# Patient Record
Sex: Female | Born: 1985 | Race: Black or African American | Hispanic: No | Marital: Married | Smoking: Former smoker
Health system: Southern US, Community
[De-identification: ages and names within clinical notes are randomized; demographics above are authoritative.]

## PROBLEM LIST (undated history)

## (undated) DIAGNOSIS — Z789 Other specified health status: Secondary | ICD-10-CM

## (undated) HISTORY — DX: Other specified health status: Z78.9

## (undated) HISTORY — PX: MOUTH SURGERY: SHX715

---
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
## 2007-02-13 ENCOUNTER — Other Ambulatory Visit: Admission: RE | Admit: 2007-02-13 | Discharge: 2007-02-13 | Payer: Self-pay | Admitting: Family Medicine

## 2007-11-14 ENCOUNTER — Other Ambulatory Visit: Admission: RE | Admit: 2007-11-14 | Discharge: 2007-11-14 | Payer: Self-pay | Admitting: Obstetrics and Gynecology

## 2008-01-30 ENCOUNTER — Emergency Department (HOSPITAL_COMMUNITY): Admission: EM | Admit: 2008-01-30 | Discharge: 2008-01-30 | Payer: Self-pay | Admitting: Emergency Medicine

## 2008-03-16 ENCOUNTER — Encounter: Admission: RE | Admit: 2008-03-16 | Discharge: 2008-03-16 | Payer: Self-pay | Admitting: Family Medicine

## 2008-07-21 ENCOUNTER — Encounter: Admission: RE | Admit: 2008-07-21 | Discharge: 2008-07-21 | Payer: Self-pay | Admitting: Family Medicine

## 2008-12-07 ENCOUNTER — Other Ambulatory Visit: Admission: RE | Admit: 2008-12-07 | Discharge: 2008-12-07 | Payer: Self-pay | Admitting: Family Medicine

## 2009-06-19 ENCOUNTER — Emergency Department (HOSPITAL_COMMUNITY): Admission: EM | Admit: 2009-06-19 | Discharge: 2009-06-19 | Payer: Self-pay | Admitting: Emergency Medicine

## 2009-12-20 ENCOUNTER — Emergency Department (HOSPITAL_COMMUNITY): Admission: EM | Admit: 2009-12-20 | Discharge: 2009-12-20 | Payer: Self-pay | Admitting: Family Medicine

## 2010-10-02 ENCOUNTER — Encounter: Payer: Self-pay | Admitting: Family Medicine

## 2010-11-29 LAB — POCT URINALYSIS DIP (DEVICE)
Bilirubin Urine: NEGATIVE
Glucose, UA: NEGATIVE mg/dL
Ketones, ur: NEGATIVE mg/dL
Nitrite: NEGATIVE
Protein, ur: NEGATIVE mg/dL
Specific Gravity, Urine: 1.01 (ref 1.005–1.030)
Urobilinogen, UA: 0.2 mg/dL (ref 0.0–1.0)
pH: 6.5 (ref 5.0–8.0)

## 2010-11-29 LAB — URINE CULTURE: Colony Count: >=100000

## 2013-09-19 ENCOUNTER — Ambulatory Visit (INDEPENDENT_AMBULATORY_CARE_PROVIDER_SITE_OTHER): Payer: PRIVATE HEALTH INSURANCE | Admitting: Family Medicine

## 2013-09-19 VITALS — BP 116/72 | HR 74 | Temp 98.2°F | Resp 16 | Ht 64.0 in | Wt 130.0 lb

## 2013-09-19 DIAGNOSIS — Z8619 Personal history of other infectious and parasitic diseases: Secondary | ICD-10-CM

## 2013-09-19 DIAGNOSIS — Z0000 Encounter for general adult medical examination without abnormal findings: Secondary | ICD-10-CM

## 2013-09-19 DIAGNOSIS — D649 Anemia, unspecified: Secondary | ICD-10-CM

## 2013-09-19 DIAGNOSIS — R109 Unspecified abdominal pain: Secondary | ICD-10-CM

## 2013-09-19 DIAGNOSIS — R110 Nausea: Secondary | ICD-10-CM

## 2013-09-19 DIAGNOSIS — H7490 Unspecified disorder of middle ear and mastoid, unspecified ear: Secondary | ICD-10-CM

## 2013-09-19 DIAGNOSIS — B009 Herpesviral infection, unspecified: Secondary | ICD-10-CM

## 2013-09-19 DIAGNOSIS — Z3009 Encounter for other general counseling and advice on contraception: Secondary | ICD-10-CM

## 2013-09-19 LAB — POCT CBC
Granulocyte percent: 48.3 %G (ref 37–80)
HEMATOCRIT: 36.1 % — AB (ref 37.7–47.9)
Hemoglobin: 11 g/dL — AB (ref 12.2–16.2)
Lymph, poc: 2.8 (ref 0.6–3.4)
MCH: 23.7 pg — AB (ref 27–31.2)
MCHC: 30.5 g/dL — AB (ref 31.8–35.4)
MCV: 77.9 fL — AB (ref 80–97)
MID (CBC): 0.4 (ref 0–0.9)
MPV: 8.4 fL (ref 0–99.8)
PLATELET COUNT, POC: 273 10*3/uL (ref 142–424)
POC Granulocyte: 3 (ref 2–6.9)
POC LYMPH %: 44.9 % (ref 10–50)
POC MID %: 6.8 %M (ref 0–12)
RBC: 4.64 M/uL (ref 4.04–5.48)
RDW, POC: 17.4 %
WBC: 6.2 10*3/uL (ref 4.6–10.2)

## 2013-09-19 LAB — POCT WET PREP WITH KOH
BACTERIA WET PREP HPF POC: 4
KOH Prep POC: NEGATIVE
RBC WET PREP PER HPF POC: NEGATIVE
Trichomonas, UA: NEGATIVE
Yeast Wet Prep HPF POC: NEGATIVE

## 2013-09-19 LAB — POCT URINE PREGNANCY: Preg Test, Ur: NEGATIVE

## 2013-09-19 MED ORDER — VALACYCLOVIR HCL 500 MG PO TABS: 500.0000 mg | ORAL_TABLET | Freq: Every day | ORAL | Status: DC

## 2013-09-19 MED ORDER — ETONOGESTREL-ETHINYL ESTRADIOL 0.12-0.015 MG/24HR VA RING: VAGINAL_RING | VAGINAL | Status: DC

## 2013-09-19 NOTE — Patient Instructions (Addendum)
Your urine test for pregnancy today was negative, but this needs to be repeated in 2 weeks to verify this is negative. Ok to start Nuvaring now, but need to repeat urine test in 2 weeks.  You should receive a call or letter about your lab results within the next week to 10 days.  Return to discuss nausea further at next office visit - can discuss this in 2 weeks with recheck urine test.  Start iron once per day for your anemia. Recheck blood counts in next 6 weeks.  Valtrex once per day to decrease risk of transmission and outbreak, but if you do have an outbreak - increase to twice per day for 3 days.   Keeping You Healthy  Get These Tests 1. Blood Pressure- Have your blood pressure checked once a year by your health care provider.  Normal blood pressure is 120/80. 2. Weight- Have your body mass index (BMI) calculated to screen for obesity.  BMI is measure of body fat based on height and weight.  You can also calculate your own BMI at https://www.west-esparza.com/. 3. Cholesterol- Have your cholesterol checked every 5 years starting at age 79 then yearly starting at age 81. 4. Chlamydia, HIV, and other sexually transmitted diseases- Get screened every year until age 85, then within three months of each new sexual provider. 5. Pap Smear- Every 1-3 years; discuss with your health care provider. 6. Mammogram- Every year starting at age 35  Take these medicines  Calcium with Vitamin D-Your body needs 1200 mg of Calcium each day and [phone] IU of Vitamin D daily.  Your body can only absorb 500 mg of Calcium at a time so Calcium must be taken in 2 or 3 divided doses throughout the day.  Multivitamin with folic acid- Once daily if it is possible for you to become pregnant.  Get these Immunizations  Gardasil-Series of three doses; prevents HPV related illness such as genital warts and cervical cancer.  Menactra-Single dose; prevents meningitis.  Tetanus shot- Every 10 years.  Flu shot-Every  year.  Take these steps 1. Do not smoke-Your healthcare provider can help you quit.  For tips on how to quit go to www.smokefree.gov or call 1-800 QUITNOW. 2. Be physically active- Exercise 5 days a week for at least 30 minutes.  If you are not already physically active, start slow and gradually work up to 30 minutes of moderate physical activity.  Examples of moderate activity include walking briskly, dancing, swimming, bicycling, etc. 3. Breast Cancer- A self breast exam every month is important for early detection of breast cancer.  For more information and instruction on self breast exams, ask your healthcare provider or [email or web address]. 4. Eat a healthy diet- Eat a variety of healthy foods such as fruits, vegetables, whole grains, low fat milk, low fat cheeses, yogurt, lean meats, poultry and fish, beans, nuts, tofu, etc.  For more information go to www. Thenutritionsource.org 5. Drink alcohol in moderation- Limit alcohol intake to one drink or less per day. Never drink and drive. 6. Depression- Your emotional health is as important as your physical health.  If you're feeling down or losing interest in things you normally enjoy please talk to your healthcare provider about being screened for depression. 7. Dental visit- Brush and floss your teeth twice daily; visit your dentist twice a year. 8. Eye doctor- Get an eye exam at least every 2 years. 9. Helmet use- Always wear a helmet when riding a bicycle, motorcycle, rollerblading or skateboarding. 10.  Safe sex- If you may be exposed to sexually transmitted infections, use a condom. 11. Seat belts- Seat belts can save your live; always wear one. 12. Smoke/Carbon Monoxide detectors- These detectors need to be installed on the appropriate level of your home. Replace batteries at least once a year. 13. Skin cancer- When out in the sun please cover up and use sunscreen 15 SPF or higher. 14. Violence- If anyone is  threatening or hurting you, please tell your healthcare provider.

## 2013-09-19 NOTE — Progress Notes (Signed)
Subjective:    Patient ID: Tara Stewart, female    DOB: 1986/06/26, 28 y.o.   MRN: [id number]  HPI Tara Stewart is a 28 y.o. female New pt, here for CPE, and other concerns below.  No PCP at present. Has had physical various places prior.   Last Tetanus 7 years ago.  Had Gardasil x 3.  Unknown if Hep B vaccine.   Abdominal cramping - with most recent period before new years. Cramping gone now. GoPo.  Sexually active with fiancee - not intended to become pregnant, but not trying to become pregnant. Prior on Nuvaring - would like to restart this. LNMP - 12/25-12/31/14. Hx of STI  a year or two ago - thinks had chlamydia? She and partner both took medicine. Would like STI testing today.   Has genital herpes, acyclovir in past, felt like this made her have outbreak. No outbreak in past 2-3 years. Partner has not had genital herpes.   Nausea, for 4 years - seen for physicals, not specifically to address the nausea, but treated with samples a few years ago - unknown med.  No regular meds, no otc treatments. Comes and goes, daily. No heartburn. No abd pain, no unexplained wt loss, no current f/c - had some last week that resolved.   Fainting - hx of anemia. Last fainted in October of last year.   Seen by psychiatrist in past during family problems. Denies recent mental illness.    Headaches - migraines with menses?.  No recent change.   Review of Systems As above and per 13 point ROS on patient health survey.     Objective:   Physical Exam Filed Vitals:   09/19/13 1526  BP: 116/72  Pulse: 74  Temp: 98.2 F (36.8 C)  Resp: 16  Height: 5\' 4"  (1.626 m)  Weight: 130 lb (58.968 kg)  SpO2: 100%   Results for orders placed in visit on 09/19/13  POCT URINE PREGNANCY      Result Value Range   Preg Test, Ur Negative    POCT CBC      Result Value Range   WBC 6.2  4.6 - 10.2 K/uL   Lymph, poc 2.8  0.6 - 3.4   POC LYMPH PERCENT 44.9  10 - 50 %L   MID (cbc) 0.4  0 - 0.9   POC  MID % 6.8  0 - 12 %M   POC Granulocyte 3.0  2 - 6.9   Granulocyte percent 48.3  37 - 80 %G   RBC 4.64  4.04 - 5.48 M/uL   Hemoglobin 11.0 (*) 12.2 - 16.2 g/dL   HCT, POC 40.9 (*) 81.1 - 47.9 %   MCV 77.9 (*) 80 - 97 fL   MCH, POC 23.7 (*) 27 - 31.2 pg   MCHC 30.5 (*) 31.8 - 35.4 g/dL   RDW, POC 91.4     Platelet Count, POC 273  142 - 424 K/uL   MPV 8.4  0 - 99.8 fL  POCT WET PREP WITH KOH      Result Value Range   Trichomonas, UA Negative     Clue Cells Wet Prep HPF POC 0-2     Epithelial Wet Prep HPF POC 0-5     Yeast Wet Prep HPF POC neg     Bacteria Wet Prep HPF POC 4     RBC Wet Prep HPF POC neg     WBC Wet Prep HPF POC 0-4  KOH Prep POC Negative       Assessment & Plan:   Tara MallickBrooklyn L Moore is a 28 y.o. female Nausea alone - Plan: POCT urine pregnancy, POCT CBC, Comprehensive metabolic panel, Lipase, TSH. Longstanding sx's.  hcg negative today, but to repeat in 2 weeks as recent unprotected intercourse.   History of chlamydia infection - Plan: POCT Wet Prep with KOH, RPR, HIV antibody checked.  Pap 3. Declined hepatitis testing.   Routine general medical examination at a health care facility - Plan: RPR, Pap IG, CT/NG w/ reflex HPV when ASC-U, Hepatitis B surface antigen, Hepatitis B surface antibody, Hepatitis C antibody, TSH. Anticipatory guidance below.   Anemia - Plan: POCT CBC - microcytic - suspect iron deficient. Start otc iron sulfate qd and recheck levels in next 6 weeks.   Abdominal cramping - noted more with last menses. Plan: POCT CBC, POCT Wet Prep with KOH, HIV antibody, Comprehensive metabolic panel, Lipase  - recheck if recurs/worsens.   HSV-2 (herpes simplex virus 2) infection - Plan: RPR, HIV antibody, valACYclovir (VALTREX) 500 MG tablet QD, increase to BID if outbreak.   Other general counseling and advice for contraceptive management - Plan: etonogestrel-ethinyl estradiol (NUVARING) 0.12-0.015 MG/24HR vaginal ring. Option discussed for backup method  only until recheck hcg, but agreed to start nuvaring, recheck in 2 weeks for repeat HCG.  Meds ordered this encounter  Medications  . etonogestrel-ethinyl estradiol (NUVARING) 0.12-0.015 MG/24HR vaginal ring    Sig: Insert vaginally and leave in place for 3 consecutive weeks, then remove for 1 week.    Dispense:  1 each    Refill:  12  . valACYclovir (VALTREX) 500 MG tablet    Sig: Take 1 tablet (500 mg total) by mouth daily.    Dispense:  30 tablet    Refill:  11   Patient Instructions  Your urine test for pregnancy today was negative, but this needs to be repeated in 2 weeks to verify this is negative. Ok to start Nuvaring now, but need to repeat urine test in 2 weeks.  You should receive a call or letter about your lab results within the next week to 10 days.  Return to discuss nausea further at next office visit - can discuss this in 2 weeks with recheck urine test.  Start iron once per day for your anemia. Recheck blood counts in next 6 weeks.  Valtrex once per day to decrease risk of transmission and outbreak, but if you do have an outbreak - increase to twice per day for 3 days.   Keeping You Healthy  Get These Tests 1. Blood Pressure- Have your blood pressure checked once a year by your health care provider.  Normal blood pressure is 120/80. 2. Weight- Have your body mass index (BMI) calculated to screen for obesity.  BMI is measure of body fat based on height and weight.  You can also calculate your own BMI at https://www.west-esparza.com/www.nhlbisupport.com/bmi/. 3. Cholesterol- Have your cholesterol checked every 5 years starting at age 26 then yearly starting at age 26. 4. Chlamydia, HIV, and other sexually transmitted diseases- Get screened every year until age 26, then within three months of each new sexual provider. 5. Pap Smear- Every 1-3 years; discuss with your health care provider. 6. Mammogram- Every year starting at age 26  Take these medicines  Calcium with Vitamin D-Your body needs 1200 mg  of Calcium each day and [phone] IU of Vitamin D daily.  Your body can only absorb 500 mg  of Calcium at a time so Calcium must be taken in 2 or 3 divided doses throughout the day.  Multivitamin with folic acid- Once daily if it is possible for you to become pregnant.  Get these Immunizations  Gardasil-Series of three doses; prevents HPV related illness such as genital warts and cervical cancer.  Menactra-Single dose; prevents meningitis.  Tetanus shot- Every 10 years.  Flu shot-Every year.  Take these steps 1. Do not smoke-Your healthcare provider can help you quit.  For tips on how to quit go to www.smokefree.gov or call 1-800 QUITNOW. 2. Be physically active- Exercise 5 days a week for at least 30 minutes.  If you are not already physically active, start slow and gradually work up to 30 minutes of moderate physical activity.  Examples of moderate activity include walking briskly, dancing, swimming, bicycling, etc. 3. Breast Cancer- A self breast exam every month is important for early detection of breast cancer.  For more information and instruction on self breast exams, ask your healthcare provider or [place]. 4. Eat a healthy diet- Eat a variety of healthy foods such as fruits, vegetables, whole grains, low fat milk, low fat cheeses, yogurt, lean meats, poultry and fish, beans, nuts, tofu, etc.  For more information go to www. Thenutritionsource.org 5. Drink alcohol in moderation- Limit alcohol intake to one drink or less per day. Never drink and drive. 6. Depression- Your emotional health is as important as your physical health.  If you're feeling down or losing interest in things you normally enjoy please talk to your healthcare provider about being screened for depression. 7. Dental visit- Brush and floss your teeth twice daily; visit your dentist twice a year. 8. Eye doctor- Get an eye exam at least every 2 years. 9. Helmet use- Always wear a  helmet when riding a bicycle, motorcycle, rollerblading or skateboarding. 10. Safe sex- If you may be exposed to sexually transmitted infections, use a condom. 11. Seat belts- Seat belts can save your live; always wear one. 12. Smoke/Carbon Monoxide detectors- These detectors need to be installed on the appropriate level of your home. Replace batteries at least once a year. 13. Skin cancer- When out in the sun please cover up and use sunscreen 15 SPF or higher. 14. Violence- If anyone is threatening or hurting you, please tell your healthcare provider.

## 2013-09-20 LAB — HEPATITIS B SURFACE ANTIBODY, QUANTITATIVE: Hepatitis B-Post: 853 m[IU]/mL

## 2013-09-20 LAB — COMPREHENSIVE METABOLIC PANEL
ALT: 13 U/L (ref 0–35)
AST: 21 U/L (ref 0–37)
Albumin: 4.2 g/dL (ref 3.5–5.2)
Alkaline Phosphatase: 40 U/L (ref 39–117)
BILIRUBIN TOTAL: 0.5 mg/dL (ref 0.3–1.2)
BUN: 6 mg/dL (ref 6–23)
CALCIUM: 9.2 mg/dL (ref 8.4–10.5)
CHLORIDE: 106 meq/L (ref 96–112)
CO2: 25 meq/L (ref 19–32)
CREATININE: 0.67 mg/dL (ref 0.50–1.10)
GLUCOSE: 79 mg/dL (ref 70–99)
Potassium: 4 mEq/L (ref 3.5–5.3)
Sodium: 137 mEq/L (ref 135–145)
Total Protein: 6.5 g/dL (ref 6.0–8.3)

## 2013-09-20 LAB — TSH: TSH: 2.203 u[IU]/mL (ref 0.350–4.500)

## 2013-09-20 LAB — RPR

## 2013-09-20 LAB — HIV ANTIBODY (ROUTINE TESTING W REFLEX): HIV: NONREACTIVE

## 2013-09-20 LAB — HEPATITIS C ANTIBODY: HCV AB: NEGATIVE

## 2013-09-20 LAB — LIPASE: LIPASE: 24 U/L (ref 0–75)

## 2013-09-20 LAB — HEPATITIS B SURFACE ANTIGEN: Hepatitis B Surface Ag: NEGATIVE

## 2013-09-22 LAB — PAP IG, CT-NG, RFX HPV ASCU
CHLAMYDIA PROBE AMP: NEGATIVE
GC PROBE AMP: NEGATIVE

## 2013-09-24 LAB — HUMAN PAPILLOMAVIRUS, HIGH RISK: HPV DNA HIGH RISK: NOT DETECTED

## 2014-02-25 ENCOUNTER — Ambulatory Visit: Payer: PRIVATE HEALTH INSURANCE | Admitting: Women's Health

## 2016-01-03 LAB — CYTOLOGY - PAP: PAP SMEAR: NEGATIVE

## 2016-01-04 ENCOUNTER — Other Ambulatory Visit (HOSPITAL_COMMUNITY): Payer: Self-pay | Admitting: Obstetrics

## 2016-01-04 DIAGNOSIS — N979 Female infertility, unspecified: Secondary | ICD-10-CM

## 2016-01-09 ENCOUNTER — Ambulatory Visit (HOSPITAL_COMMUNITY)
Admission: RE | Admit: 2016-01-09 | Discharge: 2016-01-09 | Disposition: A | Discharge status: Home / Self Care | Payer: 59 | Source: Ambulatory Visit | Attending: Obstetrics | Admitting: Obstetrics

## 2016-01-09 DIAGNOSIS — N83201 Unspecified ovarian cyst, right side: Secondary | ICD-10-CM | POA: Diagnosis not present

## 2016-01-09 DIAGNOSIS — N979 Female infertility, unspecified: Secondary | ICD-10-CM | POA: Insufficient documentation

## 2016-01-09 DIAGNOSIS — N83202 Unspecified ovarian cyst, left side: Secondary | ICD-10-CM | POA: Diagnosis not present

## 2016-01-13 ENCOUNTER — Ambulatory Visit (HOSPITAL_COMMUNITY): Payer: 59

## 2016-04-09 ENCOUNTER — Encounter: Payer: Self-pay | Admitting: Obstetrics & Gynecology

## 2016-04-09 ENCOUNTER — Ambulatory Visit (INDEPENDENT_AMBULATORY_CARE_PROVIDER_SITE_OTHER): Payer: 59 | Admitting: Obstetrics & Gynecology

## 2016-04-09 VITALS — BP 125/88 | HR 77 | Temp 98.6°F | Ht 65.5 in | Wt 175.2 lb

## 2016-04-09 DIAGNOSIS — Z319 Encounter for procreative management, unspecified: Secondary | ICD-10-CM | POA: Diagnosis not present

## 2016-04-09 MED ORDER — CLOMIPHENE CITRATE 50 MG PO TABS: 100.0000 mg | ORAL_TABLET | Freq: Every day | ORAL | 2 refills | Status: DC

## 2016-04-09 NOTE — Progress Notes (Signed)
   CLINIC ENCOUNTER NOTE  History:  30 y.o. G0P0000 here today for follow up after being started on Clomid for infertility by Dr. Gaynell Face.  She was started on Clomid 50 mg; already had a negative pelvic ultrasound (no HSG) and husband had normal evaluation (there was a notation about 'slow sperm' but with normal follow up evaluation). She denies any abnormal vaginal discharge, bleeding, pelvic pain or other concerns.  She has regular periods.  No past medical history on file.  Past Surgical History:  Procedure Laterality Date  . MOUTH SURGERY      The following portions of the patient's history were reviewed and updated as appropriate: allergies, current medications, past family history, past medical history, past social history, past surgical history and problem list.   Health Maintenance:  Normal pap on 01/03/2016.    Review of Systems:  Pertinent items noted in HPI and remainder of comprehensive ROS otherwise negative.  Objective:  Physical Exam BP 125/88   Pulse 77   Temp 98.6 F (37 C) (Oral)   Ht 5' 5.5" (1.664 m)   Wt 175 lb 3.2 oz (79.5 kg)   LMP 03/01/2016 (Exact Date)   BMI 28.71 kg/m  CONSTITUTIONAL: Well-developed, well-nourished female in no acute distress.  HENT:  Normocephalic, atraumatic. External right and left ear normal. Oropharynx is clear and moist EYES: Conjunctivae and EOM are normal. Pupils are equal, round, and reactive to light. No scleral icterus.  NECK: Normal range of motion, supple, no masses SKIN: Skin is warm and dry. No rash noted. Not diaphoretic. No erythema. No pallor. NEUROLOGIC: Alert and oriented to person, place, and time. Normal reflexes, muscle tone coordination. No cranial nerve deficit noted. PSYCHIATRIC: Normal mood and affect. Normal behavior. Normal judgment and thought content. CARDIOVASCULAR: Normal heart rate noted RESPIRATORY: Effort and breath sounds normal, no problems with respiration noted ABDOMEN: Soft, no distention  noted.   PELVIC: Deferred MUSCULOSKELETAL: Normal range of motion. No edema noted.   Assessment & Plan:  Infertility management Increased dosage of Clomid to 100 mg.  Discussed possibility of Femara, patient will research and decide if she wants to try this medication in lieu of Clomid.  Discussed need for HSG later, if conception does not occur. Patient declines HSG for now and IVF specialist for now.  Routine preventative health maintenance measures emphasized. Please refer to After Visit Summary for other counseling recommendations.   Return in about 2 months (around 06/09/2016) for Followup infertility.   Total face-to-face time with patient: 20 minutes. Over 50% of encounter was spent on counseling and coordination of care.   Jaynie Collins, MD, FACOG Attending Obstetrician & Gynecologist, [facility] for [employer], [facility]

## 2016-04-09 NOTE — Patient Instructions (Addendum)
Femara  (letrozole) for ovulation induction   CLOMID PATIENT INSTRUCTIONS  WHY USE IT? Clomid helps your ovaries to release eggs (ovulate).  HOW TO USE IT? Clomid is taken as a pill usually on days 5,6,7,8, & 9 of your cycle.  Day 1 is the first day of your period. The dose or duration may be changed to achieve ovulation.  Provera (progesterone) may first be used to bring on a period for some patients.  The day of ovulation on Clomid is usually between cycle day 14 and 17.  Having sexual intercourse at least every other day between cycle day 13 and 18 will improve your chances of becoming pregnant during the Clomid cycle.  You may monitor your ovulation using basal body temperature charts or with ovulation kits.  If using the ovulation predictor kits, having intercourse the day of the surge and the two days following is recommended. If you get your period, call when it starts for an appointment with your doctor, so that an exam may be done, and another Clomid cycle can be considered if appropriate. If you do not get a period by day 35 of the cycle, please get a blood pregnancy test.  If it is negative, speak to your doctor for instructions to bring on another period and to plan a follow-up appointment.  THINGS TO KNOW: If you get pregnant while using Clomid, your chance of twins is 7% and triplets is less than 1%. Some studies have suggested the use of "fertility drugs" may increase your risk of ovarian cancers in the future.  It is unclear if these drugs increase the risk, or people who have problems with fertility are prone for these cancers.  If there is an actual risk, it is very low.  If you have a history of liver problems or ovarian cancer, it may be wise to avoid this medication.  SIDE EFFECTS:  The most common side effect is hot flashes (20%).  Breast tenderness, headaches, nausea, bloating may also occur at different times.  Less than 3/1,000 people have dryness or loss of  hair.  Persistent ovarian cysts may form from the use of this medication.  Ovarian hyperstimulation syndrome is a rare side effect at low doses.  Visual changes like flashes of light or blurring.

## 2016-04-13 ENCOUNTER — Telehealth: Payer: Self-pay | Admitting: *Deleted

## 2016-04-13 DIAGNOSIS — N979 Female infertility, unspecified: Secondary | ICD-10-CM

## 2016-04-13 NOTE — Telephone Encounter (Signed)
Patient states she was told if she decided to change her medication- she should call and let the provider know. She would like to change from the Clomid to Femara. Patient gives permission to leave messages on her voice mail as she is at work.

## 2016-04-16 ENCOUNTER — Encounter: Payer: Self-pay | Admitting: *Deleted

## 2016-04-16 MED ORDER — LETROZOLE 2.5 MG PO TABS: 2.5000 mg | ORAL_TABLET | Freq: Every day | ORAL | 2 refills | Status: DC

## 2016-04-16 NOTE — Telephone Encounter (Signed)
Femara prescribed. Patient called and her mailbox was full.  Please call later to tell her the medication was prescribed.  She can return to clinic in 2 months for reevaluation.  Tereso NewcomerUgonna A Nicholis Stepanek, MD

## 2016-04-16 NOTE — Addendum Note (Signed)
Addended by: Jaynie CollinsANYANWU, Abdi Husak A on: 04/16/2016 04:11 PM   Modules accepted: Orders

## 2016-04-23 ENCOUNTER — Encounter: Payer: Self-pay | Admitting: *Deleted

## 2016-04-23 NOTE — Telephone Encounter (Signed)
Attempt to call patient- mailbox full. Letter with provider recommendations mailed to patient

## 2016-04-26 ENCOUNTER — Telehealth: Payer: Self-pay | Admitting: Obstetrics & Gynecology

## 2016-04-26 NOTE — Telephone Encounter (Signed)
Pt stated that they discussed switching her Femara. Pt uses the [facility] on [address]. Please advise.

## 2016-04-27 NOTE — Telephone Encounter (Signed)
Letter has been mailed.

## 2016-05-05 ENCOUNTER — Encounter: Payer: Self-pay | Admitting: Obstetrics & Gynecology

## 2016-05-31 ENCOUNTER — Encounter: Payer: Self-pay | Admitting: Obstetrics & Gynecology

## 2016-10-24 ENCOUNTER — Encounter: Payer: Self-pay | Admitting: Obstetrics and Gynecology

## 2016-10-24 ENCOUNTER — Ambulatory Visit (INDEPENDENT_AMBULATORY_CARE_PROVIDER_SITE_OTHER): Payer: Managed Care, Other (non HMO) | Admitting: Obstetrics and Gynecology

## 2016-10-24 DIAGNOSIS — N979 Female infertility, unspecified: Secondary | ICD-10-CM

## 2016-10-24 DIAGNOSIS — N970 Female infertility associated with anovulation: Secondary | ICD-10-CM | POA: Insufficient documentation

## 2016-10-24 MED ORDER — LETROZOLE 2.5 MG PO TABS: 2.5000 mg | ORAL_TABLET | Freq: Every day | ORAL | 2 refills | Status: AC

## 2016-10-24 NOTE — Progress Notes (Addendum)
Patient is here to get Fertility medication- Femara.  Ms Tara Stewart is a nulligravid female here for follow up of her anovulatory cycles. She has had a normal U/S and her husband has had a normal semen analysis. Husband healthy, 7529 and has not fathered children. She has tried Clomid without success. She discussed trying Femara at last visit but never did. She would like to try.  Cycles are monthly q21-25 days, she thinks, last 7 days. Current LMP is today. She denies any chronic medical problems  PE AF VSS Lungs clear Heart RRR Abd soft + BS  A/P Anovulatory cycles  Will start Femara 2.5 mg CD 3-7. U/R/B reviewed. Will check progesterone level on CD 21.  Pt instructed to keep menses calendar. Timing of IC reviewed as well. F/U pending lab results.

## 2016-11-13 ENCOUNTER — Other Ambulatory Visit: Payer: Self-pay

## 2017-01-05 IMAGING — US US TRANSVAGINAL NON-OB
1 series · 15 of 25 positions shown · non-contrast
Comparison: None

CLINICAL DATA: Infertility.

EXAM:
TRANSABDOMINAL AND TRANSVAGINAL ULTRASOUND OF PELVIS
TECHNIQUE: Both transabdominal and transvaginal ultrasound examinations of the
pelvis were performed. Transabdominal technique was performed for
global imaging of the pelvis including uterus, ovaries, adnexal
regions, and pelvic cul-de-sac. It was necessary to proceed with
endovaginal exam following the transabdominal exam to visualize the
uterus and ovaries.

[Series 1: us transvaginal non-ob · 15 of 63 slices shown]
[im 1/63]
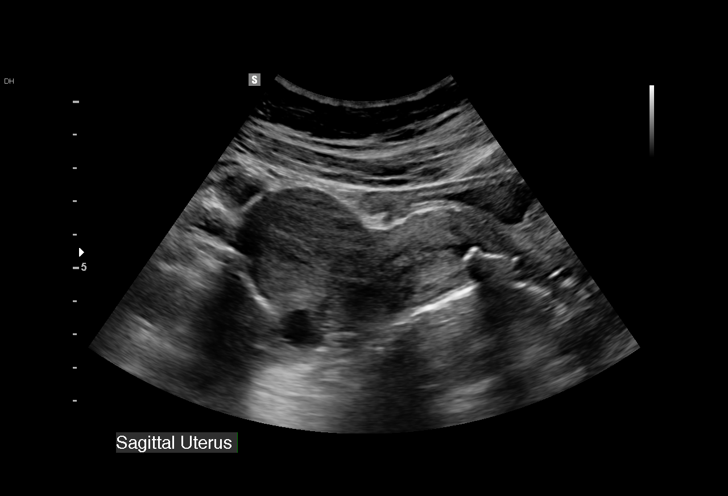
[im 6/63]
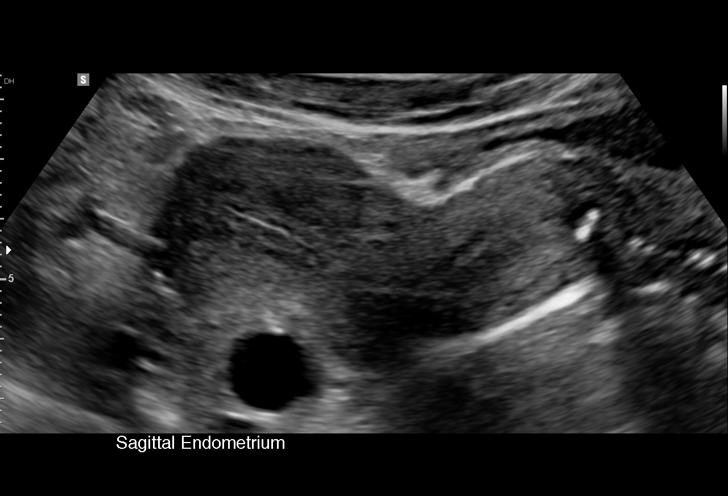
[im 11/63]
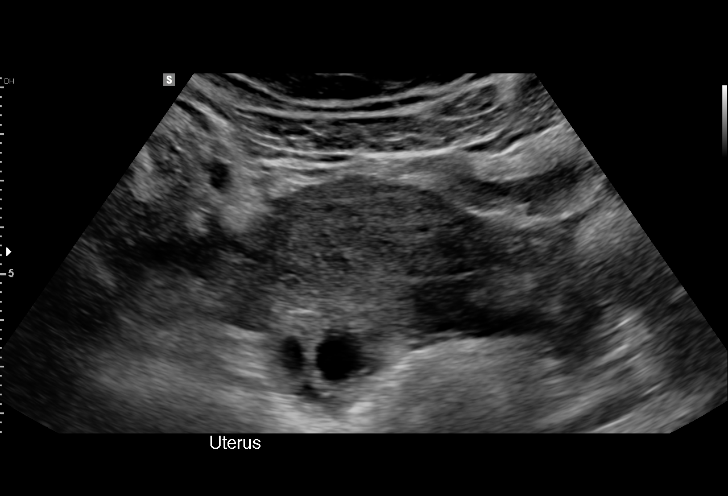
[im 13/63]
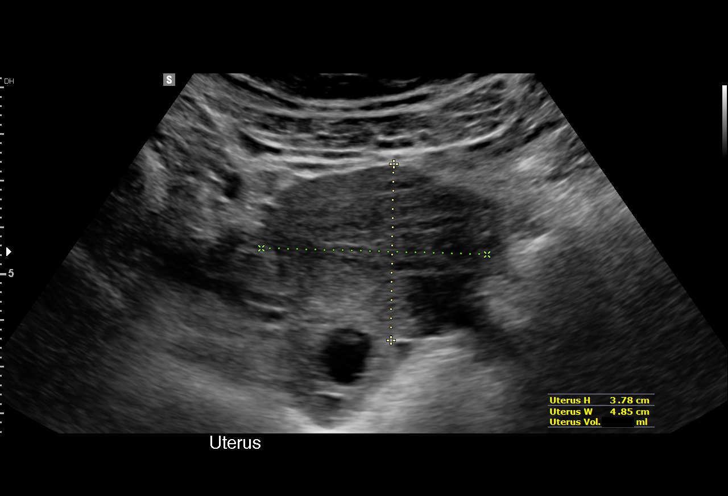
[im 19/63]
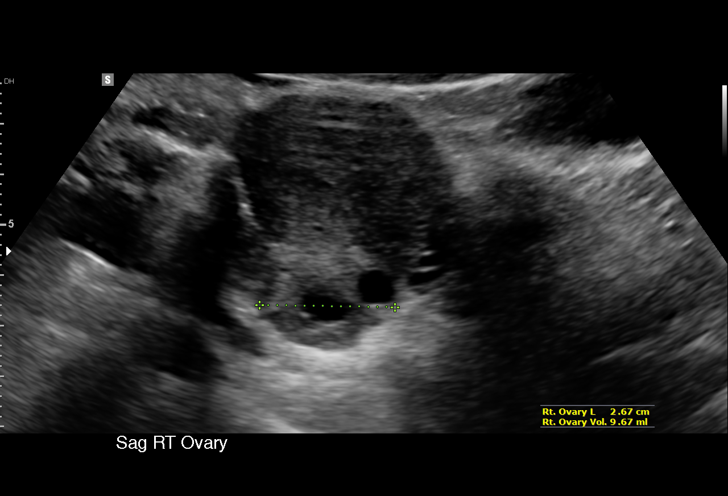
[im 24/63]
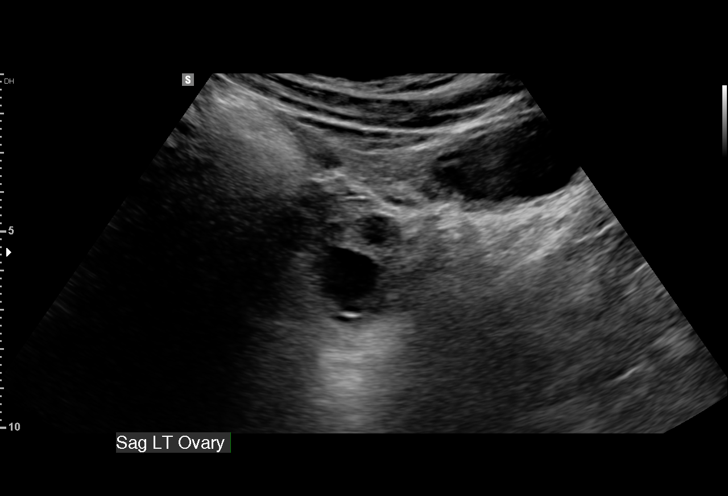
[im 26/63]
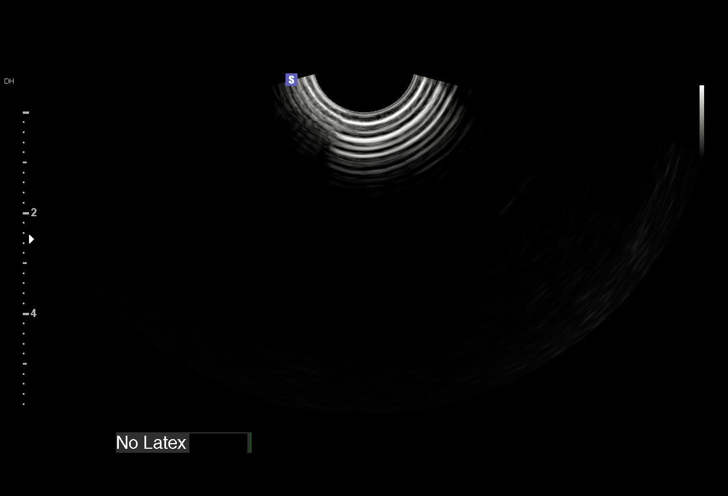
[im 32/63]
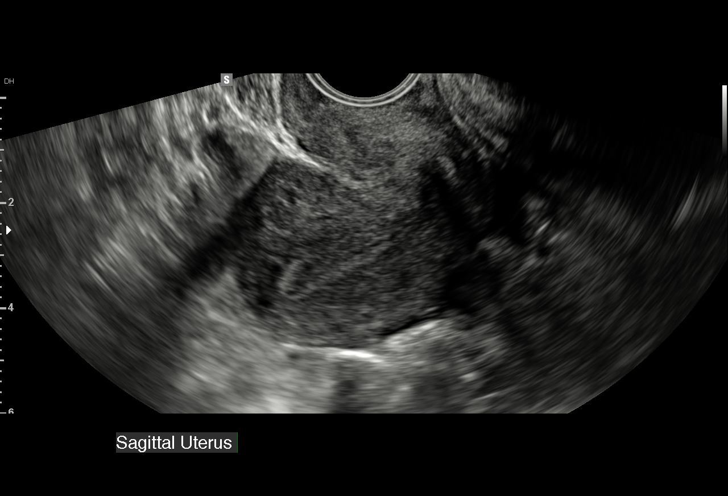
[im 37/63]
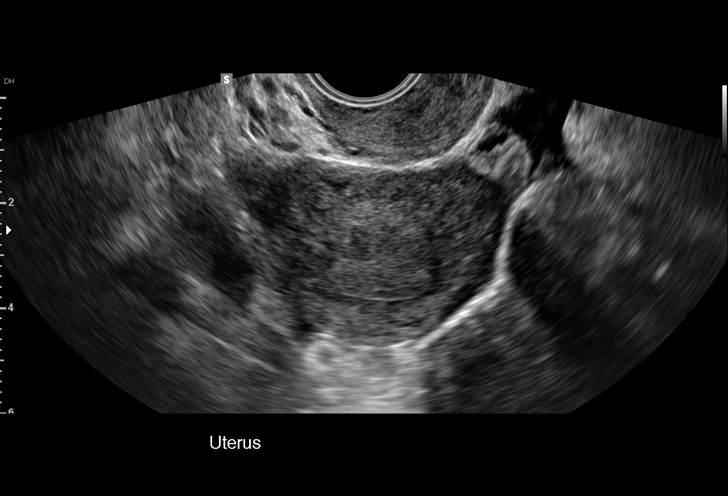
[im 39/63]
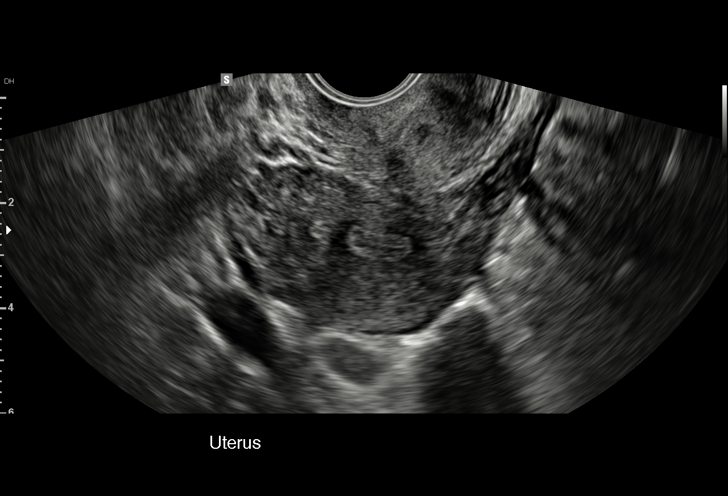
[im 44/63]
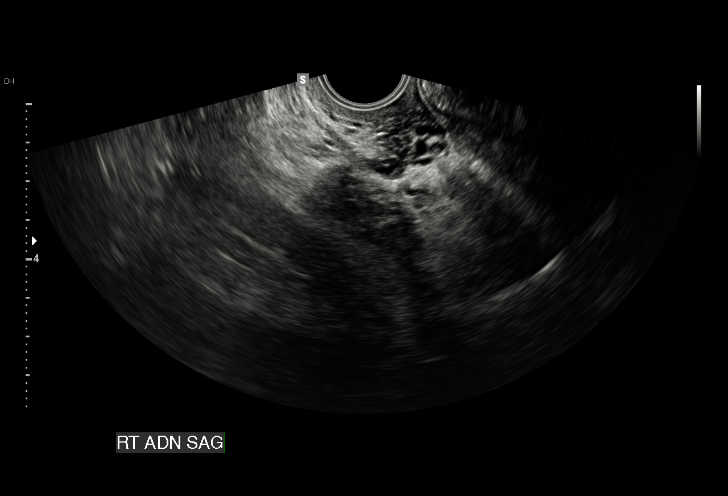
[im 50/63]
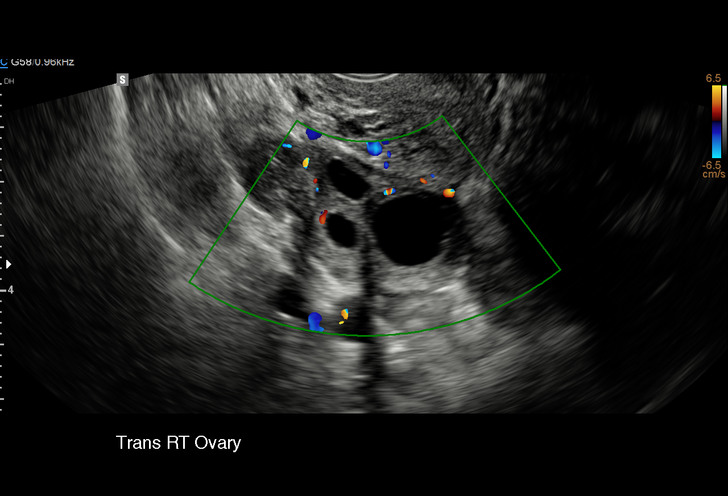
[im 52/63]
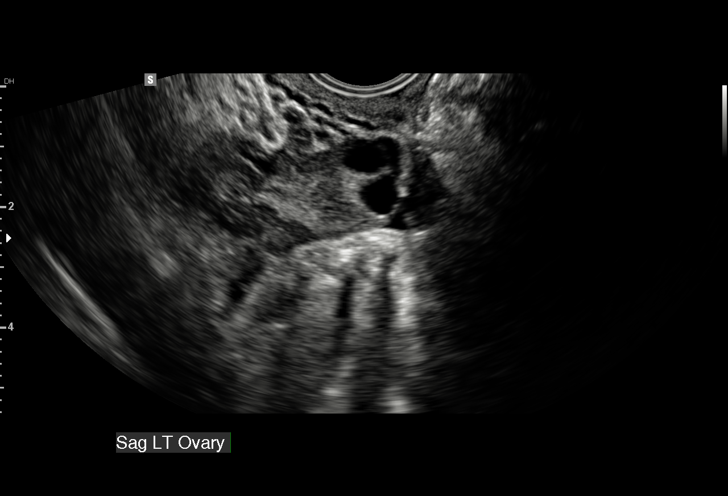
[im 57/63]
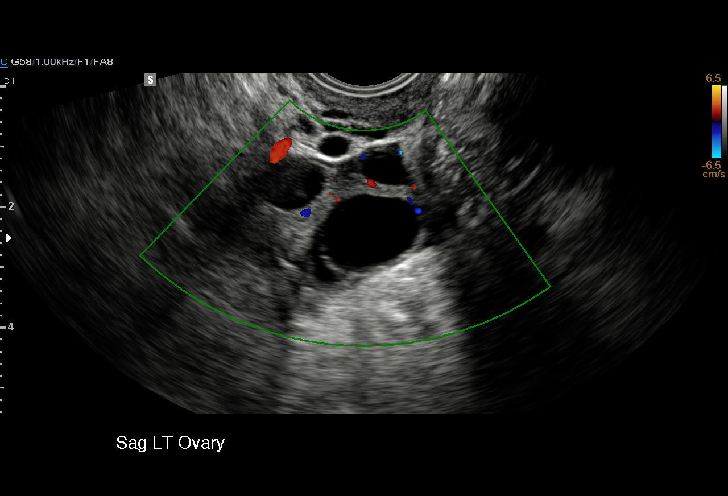
[im 63/63]
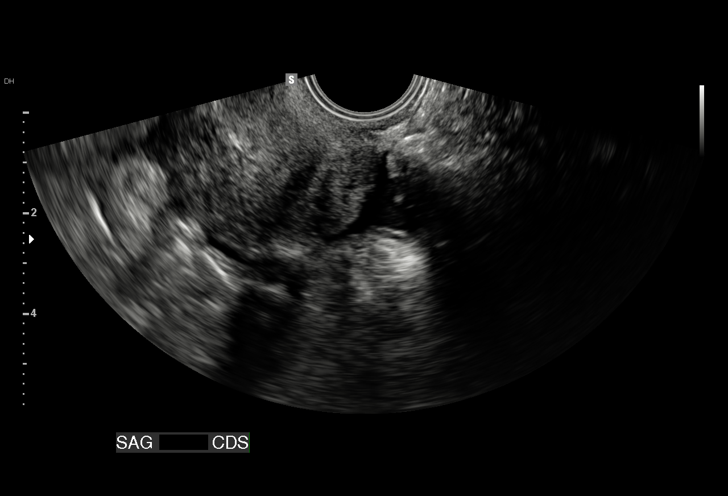

[15 of 25 positions shown; findings below may reference images not displayed]

FINDINGS: Uterus

Measurements: 8.0 x 3.2 x 4.3 cm. No fibroids or other mass
visualized.

Endometrium

Thickness: 6.6 mm.  No focal abnormality visualized.

Right ovary

Measurements: 2.1 x 3.1 x 2.2 cm. Multiple follicular cysts.

Left ovary

Measurements: 3.1 x 2.7 x 1.9 cm. Multiple follicular cysts.

Other findings

Trace free pelvic fluid .
IMPRESSION: Multiple bilateral follicular ovarian cysts . Exam otherwise
unremarkable.

## 2017-05-01 ENCOUNTER — Inpatient Hospital Stay (HOSPITAL_COMMUNITY)
Admission: AD | Admit: 2017-05-01 | Discharge: 2017-05-01 | Disposition: A | Discharge status: Home / Self Care | Payer: Managed Care, Other (non HMO) | Source: Ambulatory Visit | Attending: Obstetrics & Gynecology | Admitting: Obstetrics & Gynecology

## 2017-05-01 ENCOUNTER — Encounter (HOSPITAL_COMMUNITY): Payer: Self-pay

## 2017-05-01 DIAGNOSIS — F17290 Nicotine dependence, other tobacco product, uncomplicated: Secondary | ICD-10-CM | POA: Insufficient documentation

## 2017-05-01 DIAGNOSIS — B9689 Other specified bacterial agents as the cause of diseases classified elsewhere: Secondary | ICD-10-CM | POA: Diagnosis not present

## 2017-05-01 DIAGNOSIS — N760 Acute vaginitis: Secondary | ICD-10-CM | POA: Diagnosis not present

## 2017-05-01 DIAGNOSIS — N939 Abnormal uterine and vaginal bleeding, unspecified: Secondary | ICD-10-CM | POA: Insufficient documentation

## 2017-05-01 DIAGNOSIS — N938 Other specified abnormal uterine and vaginal bleeding: Secondary | ICD-10-CM

## 2017-05-01 LAB — CBC
HEMATOCRIT: 37.3 % (ref 36.0–46.0)
Hemoglobin: 12.5 g/dL (ref 12.0–15.0)
MCH: 27.5 pg (ref 26.0–34.0)
MCHC: 33.5 g/dL (ref 30.0–36.0)
MCV: 82 fL (ref 78.0–100.0)
Platelets: 304 10*3/uL (ref 150–400)
RBC: 4.55 MIL/uL (ref 3.87–5.11)
RDW: 15.7 % — AB (ref 11.5–15.5)
WBC: 7.6 10*3/uL (ref 4.0–10.5)

## 2017-05-01 LAB — URINALYSIS, ROUTINE W REFLEX MICROSCOPIC
Bilirubin Urine: NEGATIVE
Glucose, UA: NEGATIVE mg/dL
KETONES UR: NEGATIVE mg/dL
Leukocytes, UA: NEGATIVE
Nitrite: NEGATIVE
PH: 6 (ref 5.0–8.0)
Protein, ur: NEGATIVE mg/dL
Specific Gravity, Urine: 1.006 (ref 1.005–1.030)

## 2017-05-01 LAB — WET PREP, GENITAL
SPERM: NONE SEEN
TRICH WET PREP: NONE SEEN
YEAST WET PREP: NONE SEEN

## 2017-05-01 LAB — POCT PREGNANCY, URINE: Preg Test, Ur: NEGATIVE

## 2017-05-01 MED ORDER — METRONIDAZOLE 500 MG PO TABS: 500.0000 mg | ORAL_TABLET | Freq: Two times a day (BID) | ORAL | 0 refills | Status: DC

## 2017-05-01 MED ORDER — METRONIDAZOLE 500 MG PO TABS: 500.0000 mg | ORAL_TABLET | Freq: Two times a day (BID) | ORAL | Status: DC

## 2017-05-01 NOTE — MAU Provider Note (Signed)
History     CSN: [id number]  Arrival date and time: 05/01/17 9675   First Provider Initiated Contact with Patient 05/01/17 1903      Chief Complaint  Patient presents with  . Vaginal Bleeding   HPI 31 yo G0 here for the evaluation of a 2-week period. Patient reports normal monthly period lasting 5-7 day. She reports onset of a normal period this month but continued on for now 14 days. She reports some dysmenorrhea relieved with ibuprofen. She reports some small blood clots. She denies CP, SOB, lightheadedness/dizziness. Patient is sexually active without contraception and would welcome a pregnancy.    History reviewed. No pertinent past medical history.  Past Surgical History:  Procedure Laterality Date  . MOUTH SURGERY      Family History  Problem Relation Age of Onset  . Hypertension Maternal Grandmother   . Hyperlipidemia Maternal Grandmother   . Diabetes Maternal Grandmother   . Cancer Maternal Grandfather     Social History  Substance Use Topics  . Smoking status: Current Every Day Smoker    Packs/day: 0.25    Years: 15.00    Types: Cigars  . Smokeless tobacco: Never Used  . Alcohol use 4.2 oz/week    7 Cans of beer per week    Allergies: No Known Allergies  Prescriptions Prior to Admission  Medication Sig Dispense Refill Last Dose  . letrozole (FEMARA) 2.5 MG tablet Take 1 tablet (2.5 mg total) by mouth daily. Take on days 3 to 7 following a spontaneous menses or progestin-induced bleed. 5 tablet 2   . valACYclovir (VALTREX) 500 MG tablet Take 1 tablet (500 mg total) by mouth daily. (Patient not taking: Reported on 04/09/2016) 30 tablet 11 Not Taking    Review of Systems  See pertinent in HPI  Physical Exam   Blood pressure 130/75, pulse 80, temperature 98.3 F (36.8 C), temperature source Oral, resp. rate 16, height 5\' 5"  (1.651 m), weight 161 lb (73 kg), last menstrual period 04/16/2017, SpO2 100 %.  Physical Exam GENERAL: Well-developed,  well-nourished female in no acute distress.  ABDOMEN: Soft, nontender, nondistended.  PELVIC: Normal external female genitalia. Vagina is pink and rugated.  Normal discharge. Normal appearing cervix. Uterus is normal in size. No adnexal mass or tenderness. EXTREMITIES: No cyanosis, clubbing, or edema, 2+ distal pulses.  MAU Course  Procedures  MDM Results for orders placed or performed during the hospital encounter of 05/01/17 (from the past 24 hour(s))  Pregnancy, urine POC     Status: None   Collection Time: 05/01/17  6:55 PM  Result Value Ref Range   Preg Test, Ur NEGATIVE NEGATIVE  Wet prep, genital     Status: Abnormal   Collection Time: 05/01/17  7:00 PM  Result Value Ref Range   Yeast Wet Prep HPF POC NONE SEEN NONE SEEN   Trich, Wet Prep NONE SEEN NONE SEEN   Clue Cells Wet Prep HPF POC PRESENT (A) NONE SEEN   WBC, Wet Prep HPF POC FEW (A) NONE SEEN   Sperm NONE SEEN   CBC     Status: Abnormal   Collection Time: 05/01/17  7:23 PM  Result Value Ref Range   WBC 7.6 4.0 - 10.5 K/uL   RBC 4.55 3.87 - 5.11 MIL/uL   Hemoglobin 12.5 12.0 - 15.0 g/dL   HCT 91.6 38.4 - 66.5 %   MCV 82.0 78.0 - 100.0 fL   MCH 27.5 26.0 - 34.0 pg   MCHC 33.5 30.0 -  36.0 g/dL   RDW 16.1 (H) 09.6 - 04.5 %   Platelets 304 150 - 400 K/uL  Urinalysis, Routine w reflex microscopic     Status: Abnormal   Collection Time: 05/01/17  7:29 PM  Result Value Ref Range   Color, Urine YELLOW YELLOW   APPearance CLEAR CLEAR   Specific Gravity, Urine 1.006 1.005 - 1.030   pH 6.0 5.0 - 8.0   Glucose, UA NEGATIVE NEGATIVE mg/dL   Hgb urine dipstick LARGE (A) NEGATIVE   Bilirubin Urine NEGATIVE NEGATIVE   Ketones, ur NEGATIVE NEGATIVE mg/dL   Protein, ur NEGATIVE NEGATIVE mg/dL   Nitrite NEGATIVE NEGATIVE   Leukocytes, UA NEGATIVE NEGATIVE   RBC / HPF TOO NUMEROUS TO COUNT 0 - 5 RBC/hpf   WBC, UA 0-5 0 - 5 WBC/hpf   Bacteria, UA FEW (A) NONE SEEN   Squamous Epithelial / LPF 0-5 (A) NONE SEEN   Mucus  PRESENT      Assessment and Plan  31 yo with DUB and BV - Results reviewed with the patient - Rx Flagyl provided - Patient to follow up with GYN for out patient evaluation if abnormal bleeding persists - Precautions reviewed  Tara Stewart 05/01/2017, 8:06 PM

## 2017-05-01 NOTE — Discharge Instructions (Signed)
Bacterial Vaginosis Bacterial vaginosis is a vaginal infection that occurs when the normal balance of bacteria in the vagina is disrupted. It results from an overgrowth of certain bacteria. This is the most common vaginal infection among women ages 15-44. Because bacterial vaginosis increases your risk for STIs (sexually transmitted infections), getting treated can help reduce your risk for chlamydia, gonorrhea, herpes, and HIV (human immunodeficiency virus). Treatment is also important for preventing complications in pregnant women, because this condition can cause an early (premature) delivery. What are the causes? This condition is caused by an increase in harmful bacteria that are normally present in small amounts in the vagina. However, the reason that the condition develops is not fully understood. What increases the risk? The following factors may make you more likely to develop this condition:  Having a new sexual partner or multiple sexual partners.  Having unprotected sex.  Douching.  Having an intrauterine device (IUD).  Smoking.  Drug and alcohol abuse.  Taking certain antibiotic medicines.  Being pregnant.  You cannot get bacterial vaginosis from toilet seats, bedding, swimming pools, or contact with objects around you. What are the signs or symptoms? Symptoms of this condition include:  Grey or white vaginal discharge. The discharge can also be watery or foamy.  A fish-like odor with discharge, especially after sexual intercourse or during menstruation.  Itching in and around the vagina.  Burning or pain with urination.  Some women with bacterial vaginosis have no signs or symptoms. How is this diagnosed? This condition is diagnosed based on:  Your medical history.  A physical exam of the vagina.  Testing a sample of vaginal fluid under a microscope to look for a large amount of bad bacteria or abnormal cells. Your health care provider may use a cotton swab  or a small wooden spatula to collect the sample.  How is this treated? This condition is treated with antibiotics. These may be given as a pill, a vaginal cream, or a medicine that is put into the vagina (suppository). If the condition comes back after treatment, a second round of antibiotics may be needed. Follow these instructions at home: Medicines  Take over-the-counter and prescription medicines only as told by your health care provider.  Take or use your antibiotic as told by your health care provider. Do not stop taking or using the antibiotic even if you start to feel better. General instructions  If you have a female sexual partner, tell her that you have a vaginal infection. She should see her health care provider and be treated if she has symptoms. If you have a female sexual partner, he does not need treatment.  During treatment: ? Avoid sexual activity until you finish treatment. ? Do not douche. ? Avoid alcohol as directed by your health care provider. ? Avoid breastfeeding as directed by your health care provider.  Drink enough water and fluids to keep your urine clear or pale yellow.  Keep the area around your vagina and rectum clean. ? Wash the area daily with warm water. ? Wipe yourself from front to back after using the toilet.  Keep all follow-up visits as told by your health care provider. This is important. How is this prevented?  Do not douche.  Wash the outside of your vagina with warm water only.  Use protection when having sex. This includes latex condoms and dental dams.  Limit how many sexual partners you have. To help prevent bacterial vaginosis, it is best to have sex with just   one partner (monogamous).  Make sure you and your sexual partner are tested for STIs.  Wear cotton or cotton-lined underwear.  Avoid wearing tight pants and pantyhose, especially during summer.  Limit the amount of alcohol that you drink.  Do not use any products that  contain nicotine or tobacco, such as cigarettes and e-cigarettes. If you need help quitting, ask your health care provider.  Do not use illegal drugs. Where to find more information:  Centers for Disease Control and Prevention: www.cdc.gov/std  American Sexual Health Association (ASHA): www.ashastd.org  [place] Department of Health and Human Services, Office on Women's Health: www.womenshealth.gov/ or https://www.womenshealth.gov/a-z-topics/bacterial-vaginosis Contact a health care provider if:  Your symptoms do not improve, even after treatment.  You have more discharge or pain when urinating.  You have a fever.  You have pain in your abdomen.  You have pain during sex.  You have vaginal bleeding between periods. Summary  Bacterial vaginosis is a vaginal infection that occurs when the normal balance of bacteria in the vagina is disrupted.  Because bacterial vaginosis increases your risk for STIs (sexually transmitted infections), getting treated can help reduce your risk for chlamydia, gonorrhea, herpes, and HIV (human immunodeficiency virus). Treatment is also important for preventing complications in pregnant women, because the condition can cause an early (premature) delivery.  This condition is treated with antibiotic medicines. These may be given as a pill, a vaginal cream, or a medicine that is put into the vagina (suppository). This information is not intended to replace advice given to you by your health care provider. Make sure you discuss any questions you have with your health care provider. Document Released: 08/27/2005 Document Revised: 05/12/2016 Document Reviewed: 05/12/2016 Elsevier Interactive Patient Education  2017 [employer]  

## 2017-05-01 NOTE — MAU Note (Signed)
Pt reports having a heavy menstrual cycle for 2 weeks.  Reports abdominal pain 7/10 and has tried ibuprofen for relief with some success.

## 2017-05-02 LAB — GC/CHLAMYDIA PROBE AMP (~~LOC~~) NOT AT ARMC
CHLAMYDIA, DNA PROBE: NEGATIVE
Neisseria Gonorrhea: NEGATIVE

## 2017-11-28 ENCOUNTER — Other Ambulatory Visit: Payer: Self-pay | Admitting: Endocrinology

## 2017-11-28 DIAGNOSIS — E010 Iodine-deficiency related diffuse (endemic) goiter: Secondary | ICD-10-CM

## 2018-09-08 ENCOUNTER — Encounter (HOSPITAL_COMMUNITY): Payer: Self-pay

## 2018-09-08 ENCOUNTER — Ambulatory Visit (HOSPITAL_COMMUNITY)
Admission: EM | Admit: 2018-09-08 | Discharge: 2018-09-08 | Disposition: A | Discharge status: Home / Self Care | Payer: Managed Care, Other (non HMO) | Attending: Family Medicine | Admitting: Family Medicine

## 2018-09-08 ENCOUNTER — Other Ambulatory Visit: Payer: Self-pay

## 2018-09-08 DIAGNOSIS — D500 Iron deficiency anemia secondary to blood loss (chronic): Secondary | ICD-10-CM | POA: Diagnosis not present

## 2018-09-08 DIAGNOSIS — F17290 Nicotine dependence, other tobacco product, uncomplicated: Secondary | ICD-10-CM | POA: Diagnosis not present

## 2018-09-08 DIAGNOSIS — Z3202 Encounter for pregnancy test, result negative: Secondary | ICD-10-CM

## 2018-09-08 DIAGNOSIS — N760 Acute vaginitis: Secondary | ICD-10-CM | POA: Diagnosis not present

## 2018-09-08 DIAGNOSIS — B9689 Other specified bacterial agents as the cause of diseases classified elsewhere: Secondary | ICD-10-CM | POA: Insufficient documentation

## 2018-09-08 DIAGNOSIS — N938 Other specified abnormal uterine and vaginal bleeding: Secondary | ICD-10-CM | POA: Insufficient documentation

## 2018-09-08 DIAGNOSIS — N730 Acute parametritis and pelvic cellulitis: Secondary | ICD-10-CM | POA: Insufficient documentation

## 2018-09-08 LAB — POCT URINALYSIS DIP (DEVICE)
Bilirubin Urine: NEGATIVE
Glucose, UA: NEGATIVE mg/dL
Ketones, ur: NEGATIVE mg/dL
Nitrite: NEGATIVE
PH: 6.5 (ref 5.0–8.0)
Protein, ur: NEGATIVE mg/dL
Specific Gravity, Urine: 1.01 (ref 1.005–1.030)
Urobilinogen, UA: 0.2 mg/dL (ref 0.0–1.0)

## 2018-09-08 LAB — POCT I-STAT, CHEM 8
BUN: <3 mg/dL — ABNORMAL LOW (ref 6–20)
CALCIUM ION: 1.16 mmol/L (ref 1.15–1.40)
Chloride: 106 mmol/L (ref 98–111)
Creatinine, Ser: 0.8 mg/dL (ref 0.44–1.00)
Glucose, Bld: 78 mg/dL (ref 70–99)
HCT: 35 % — ABNORMAL LOW (ref 36.0–46.0)
Hemoglobin: 11.9 g/dL — ABNORMAL LOW (ref 12.0–15.0)
Potassium: 3.8 mmol/L (ref 3.5–5.1)
Sodium: 139 mmol/L (ref 135–145)
TCO2: 23 mmol/L (ref 22–32)

## 2018-09-08 LAB — POCT PREGNANCY, URINE: Preg Test, Ur: NEGATIVE

## 2018-09-08 MED ORDER — NAPROXEN 500 MG PO TABS: 500.0000 mg | ORAL_TABLET | Freq: Two times a day (BID) | ORAL | 0 refills | Status: DC

## 2018-09-08 MED ORDER — METRONIDAZOLE 500 MG PO TABS: 500.0000 mg | ORAL_TABLET | Freq: Two times a day (BID) | ORAL | 0 refills | Status: DC

## 2018-09-08 MED ORDER — DOXYCYCLINE HYCLATE 100 MG PO CAPS: 100.0000 mg | ORAL_CAPSULE | Freq: Two times a day (BID) | ORAL | 0 refills | Status: DC

## 2018-09-08 NOTE — Discharge Instructions (Addendum)
We did lab testing during this visit.  If there are any abnormal findings that require change in medicine or indicate a positive result, you will be notified.  If all of your tests are normal, you will not be called.   Take the antibiotics 2 x a day with food Avoid alcohol Take the naproxen 2 x a day for bleeding and pain Follow up with your GYN for persistent symptoms Take a women's vitamin with iron

## 2018-09-08 NOTE — ED Notes (Signed)
Sent to bathroom for specimen, instructed to put on gown on return to treatment room

## 2018-09-08 NOTE — ED Triage Notes (Signed)
Pt cc pt states she has had her period for 20 days. very heavey

## 2018-09-08 NOTE — ED Provider Notes (Signed)
[facility]    CSN: [id number] Arrival date & time: 09/08/18  1524     History   Chief Complaint Chief Complaint  Patient presents with  . Abdominal Pain    HPI Tara Stewart is a 32 y.o. female.   HPI  Patient is here for dysfunctional uterine bleeding.  She is had bleeding for the last 19 or 20 days.  Her menstrual period started on December 11 as it was supposed to, and then just never stopped.  Is been waxing and waning ever since that time.  At times heavy.  Over the last week she is had increasing lower abdominal pelvic pain.  No vaginal discharge.  (Difficult to tell with bleeding).  No odor no itching no discomfort.  She is been married for 4 years and has 0 concerns about an STD.  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  She does have a history of abnormal bleeding that was caused by a BV infection, this note was reviewed from August 2018.  She would like to be pregnant, is not using birth control.  I advised her that if she has not used birth control for 4 years and is not pregnant that she needs to go for a infertility work-up. She states that she is had enough bleeding that she is starting to feel tired.  Would like a blood count.  She is had no fever chills.  No urinary frequency dysuria or urinary complaint.  No flank pain.  No nausea or vomiting  History reviewed. No pertinent past medical history.  Patient Active Problem List   Diagnosis Date Noted  . Anovulatory cycle 10/24/2016    Past Surgical History:  Procedure Laterality Date  . MOUTH SURGERY      OB History    Gravida  0   Para  0   Term  0   Preterm  0   AB  0   Living  0     SAB  0   TAB  0   Ectopic  0   Multiple  0   Live Births  0            Home Medications    Prior to Admission medications   Medication Sig Start Date End Date Taking? Authorizing Provider  doxycycline (VIBRAMYCIN) 100 MG capsule Take 1 capsule (100 mg total) by mouth 2 (two) times daily.  09/08/18   Eustace MooreNelson, Silver Achey Sue, MD  letrozole [facility](FEMARA) 2.5 MG tablet Take 1 tablet (2.5 mg total) by mouth daily. Take on days 3 to 7 following a spontaneous menses or progestin-induced bleed. 10/24/16   Hermina StaggersErvin, Michael L, MD  metroNIDAZOLE (FLAGYL) 500 MG tablet Take 1 tablet (500 mg total) by mouth 2 (two) times daily. 09/08/18   Eustace MooreNelson, Makenlee Mckeag Sue, MD  naproxen (NAPROSYN) 500 MG tablet Take 1 tablet (500 mg total) by mouth 2 (two) times daily. 09/08/18   Eustace MooreNelson, Truth Wolaver Sue, MD    Family History Family History  Problem Relation Age of Onset  . Hypertension Maternal Grandmother   . Hyperlipidemia Maternal Grandmother   . Diabetes Maternal Grandmother   . Cancer Maternal Grandfather     Social History Social History   Tobacco Use  . Smoking status: Current Every Day Smoker    Packs/day: 0.25    Years: 15.00    Pack years: 3.75    Types: Cigars  . Smokeless tobacco: Never Used  Substance Use Topics  . Alcohol use: Yes  Alcohol/week: 7.0 standard drinks    Types: 7 Cans of beer per week  . Drug use: No     Allergies   Patient has no known allergies.   Review of Systems Review of Systems  Constitutional: Negative for chills and fever.  HENT: Negative for ear pain and sore throat.   Eyes: Negative for pain and visual disturbance.  Respiratory: Negative for cough and shortness of breath.   Cardiovascular: Negative for chest pain and palpitations.  Gastrointestinal: Positive for abdominal pain. Negative for vomiting.  Genitourinary: Positive for pelvic pain and vaginal bleeding. Negative for dysuria, flank pain and hematuria.  Musculoskeletal: Negative for arthralgias and back pain.  Skin: Negative for color change and rash.  Neurological: Negative for seizures and syncope.  All other systems reviewed and are negative.    Physical Exam Triage Vital Signs ED Triage Vitals  Enc Vitals Group     BP 09/08/18 1621 117/76     Pulse --      Resp 09/08/18 1621 18     Temp  09/08/18 1621 98.3 F (36.8 C)     Temp Source 09/08/18 1621 Oral     SpO2 09/08/18 1621 100 %     Weight 09/08/18 1619 148 lb (67.1 kg)     Height --      Head Circumference --      Peak Flow --      Pain Score 09/08/18 1619 5     Pain Loc --      Pain Edu? --      Excl. in GC? --    No data found.  Updated Vital Signs BP 117/76 (BP Location: Right Arm)   Temp 98.3 F (36.8 C) (Oral)   Resp 18   Wt 67.1 kg   LMP 08/20/2018   SpO2 100%   BMI 24.63 kg/m       Physical Exam Constitutional:      General: She is not in acute distress.    Appearance: She is well-developed.  HENT:     Head: Normocephalic and atraumatic.  Eyes:     Conjunctiva/sclera: Conjunctivae normal.     Pupils: Pupils are equal, round, and reactive to light.  Neck:     Musculoskeletal: Normal range of motion.  Cardiovascular:     Rate and Rhythm: Normal rate and regular rhythm.     Heart sounds: Normal heart sounds.  Pulmonary:     Effort: Pulmonary effort is normal. No respiratory distress.     Breath sounds: Normal breath sounds.  Abdominal:     General: Abdomen is flat. Bowel sounds are normal. There is no distension.     Palpations: Abdomen is soft.     Tenderness: There is abdominal tenderness in the suprapubic area. There is no right CVA tenderness or left CVA tenderness.  Genitourinary:    Vagina: Bleeding present.     Cervix: Cervical motion tenderness present.     Comments: Active bleeding from oss.  Odor of BV.  Acute cervical motion tenderness.  Unable to complete bimanual exam because of pain. Musculoskeletal: Normal range of motion.  Skin:    General: Skin is warm and dry.  Neurological:     General: No focal deficit present.     Mental Status: She is alert. She is disoriented.  Psychiatric:        Mood and Affect: Mood normal.      UC Treatments / Results  Labs (all labs ordered are listed, but only  abnormal results are displayed) Labs Reviewed  POCT URINALYSIS DIP  (DEVICE) - Abnormal; Notable for the following components:      Result Value   Hgb urine dipstick LARGE (*)    Leukocytes, UA TRACE (*)    All other components within normal limits  POCT I-STAT, CHEM 8 - Abnormal; Notable for the following components:   BUN <3 (*)    Hemoglobin 11.9 (*)    HCT 35.0 (*)    All other components within normal limits  POCT PREGNANCY, URINE  I-STAT CHEM 8, ED  CERVICOVAGINAL ANCILLARY ONLY    EKG None  Radiology No results found.  Procedures Procedures (including critical care time)  Medications Ordered in UC Medications - No data to display  Initial Impression / Assessment and Plan / UC Course  I have reviewed the triage vital signs and the nursing notes.  Pertinent labs & imaging results that were available during my care of the patient were reviewed by me and considered in my medical decision making (see chart for details).     Patient likely has a bacterial vaginosis with some pelvic inflammatory symptoms.  Will treat with antibiotics, send cultures, and call her for any positive results. Final Clinical Impressions(s) / UC Diagnoses   Final diagnoses:  BV (bacterial vaginosis)  Acute pelvic inflammatory disease (PID)  DUB (dysfunctional uterine bleeding)  Iron deficiency anemia due to chronic blood loss     Discharge Instructions     We did lab testing during this visit.  If there are any abnormal findings that require change in medicine or indicate a positive result, you will be notified.  If all of your tests are normal, you will not be called.   Take the antibiotics 2 x a day with food Avoid alcohol Take the naproxen 2 x a day for bleeding and pain Follow up with your GYN for persistent symptoms Take a women's vitamin with iron     ED Prescriptions    Medication Sig Dispense Auth. Provider   metroNIDAZOLE (FLAGYL) 500 MG tablet Take 1 tablet (500 mg total) by mouth 2 (two) times daily. 28 tablet Eustace MooreNelson, Dee Paden Sue, MD    doxycycline (VIBRAMYCIN) 100 MG capsule Take 1 capsule (100 mg total) by mouth 2 (two) times daily. 28 capsule Eustace MooreNelson, Lizandro Spellman Sue, MD   naproxen (NAPROSYN) 500 MG tablet Take 1 tablet (500 mg total) by mouth 2 (two) times daily. 30 tablet Eustace MooreNelson, Ytzel Gubler Sue, MD     Controlled Substance Prescriptions [place] Controlled Substance Registry consulted? Not Applicable .      Eustace MooreNelson, Soham Hollett Sue, MD 09/08/18 2137

## 2018-09-12 ENCOUNTER — Encounter (HOSPITAL_COMMUNITY): Payer: Self-pay

## 2018-09-12 LAB — CERVICOVAGINAL ANCILLARY ONLY
Bacterial vaginitis: POSITIVE — AB
Chlamydia: NEGATIVE
Neisseria Gonorrhea: NEGATIVE
Trichomonas: NEGATIVE

## 2019-04-20 ENCOUNTER — Encounter (HOSPITAL_COMMUNITY): Payer: Self-pay | Admitting: Emergency Medicine

## 2019-04-20 ENCOUNTER — Ambulatory Visit (HOSPITAL_COMMUNITY)
Admission: EM | Admit: 2019-04-20 | Discharge: 2019-04-20 | Disposition: A | Discharge status: Home / Self Care | Payer: Managed Care, Other (non HMO) | Attending: Family Medicine | Admitting: Family Medicine

## 2019-04-20 ENCOUNTER — Other Ambulatory Visit: Payer: Self-pay

## 2019-04-20 DIAGNOSIS — F17290 Nicotine dependence, other tobacco product, uncomplicated: Secondary | ICD-10-CM | POA: Diagnosis not present

## 2019-04-20 DIAGNOSIS — R05 Cough: Secondary | ICD-10-CM | POA: Insufficient documentation

## 2019-04-20 DIAGNOSIS — R197 Diarrhea, unspecified: Secondary | ICD-10-CM

## 2019-04-20 DIAGNOSIS — R509 Fever, unspecified: Secondary | ICD-10-CM | POA: Diagnosis not present

## 2019-04-20 DIAGNOSIS — Z20822 Contact with and (suspected) exposure to covid-19: Secondary | ICD-10-CM

## 2019-04-20 DIAGNOSIS — M791 Myalgia, unspecified site: Secondary | ICD-10-CM

## 2019-04-20 DIAGNOSIS — Z20828 Contact with and (suspected) exposure to other viral communicable diseases: Secondary | ICD-10-CM | POA: Insufficient documentation

## 2019-04-20 DIAGNOSIS — R5383 Other fatigue: Secondary | ICD-10-CM | POA: Diagnosis not present

## 2019-04-20 DIAGNOSIS — R059 Cough, unspecified: Secondary | ICD-10-CM

## 2019-04-20 NOTE — ED Triage Notes (Signed)
Pt here for cough and body aches x 3 days  

## 2019-04-20 NOTE — ED Provider Notes (Signed)
[place]    CSN: [id number] Arrival date & time: 04/20/19  1747     History   Chief Complaint Chief Complaint  Patient presents with  . Cough  . Generalized Body Aches    HPI Tara Stewart is a 33 y.o. female.   Patient presents with nonproductive cough, fatigue, fever, and body aches for 3 days.  She also has had diarrhea today x3.  She is taking oral fluids without difficulty.  She has taken Tylenol and ibuprofen for her fever with good relief.  She denies sore throat, shortness of breath, vomiting, rash, or other symptoms.    The history is provided by the patient.    History reviewed. No pertinent past medical history.  Patient Active Problem List   Diagnosis Date Noted  . Anovulatory cycle 10/24/2016    Past Surgical History:  Procedure Laterality Date  . MOUTH SURGERY      OB History    Gravida  0   Para  0   Term  0   Preterm  0   AB  0   Living  0     SAB  0   TAB  0   Ectopic  0   Multiple  0   Live Births  0            Home Medications    Prior to Admission medications   Medication Sig Start Date End Date Taking? Authorizing Provider  doxycycline (VIBRAMYCIN) 100 MG capsule Take 1 capsule (100 mg total) by mouth 2 (two) times daily. Patient not taking: Reported on 04/20/2019 09/08/18   Raylene Everts, MD  letrozole [facility]) 2.5 MG tablet Take 1 tablet (2.5 mg total) by mouth daily. Take on days 3 to 7 following a spontaneous menses or progestin-induced bleed. Patient not taking: Reported on 04/20/2019 10/24/16   Chancy Milroy, MD  metroNIDAZOLE (FLAGYL) 500 MG tablet Take 1 tablet (500 mg total) by mouth 2 (two) times daily. Patient not taking: Reported on 04/20/2019 09/08/18   Raylene Everts, MD  naproxen (NAPROSYN) 500 MG tablet Take 1 tablet (500 mg total) by mouth 2 (two) times daily. 09/08/18   Raylene Everts, MD    Family History Family History  Problem Relation Age of Onset  . Hypertension  Maternal Grandmother   . Hyperlipidemia Maternal Grandmother   . Diabetes Maternal Grandmother   . Cancer Maternal Grandfather     Social History Social History   Tobacco Use  . Smoking status: Current Every Day Smoker    Packs/day: 0.25    Years: 15.00    Pack years: 3.75    Types: Cigars  . Smokeless tobacco: Never Used  Substance Use Topics  . Alcohol use: Yes    Alcohol/week: 7.0 standard drinks    Types: 7 Cans of beer per week  . Drug use: No     Allergies   Patient has no known allergies.   Review of Systems Review of Systems  Constitutional: Positive for fatigue and fever. Negative for chills.  HENT: Negative for congestion, ear pain, rhinorrhea and sore throat.   Eyes: Negative for pain and visual disturbance.  Respiratory: Positive for cough. Negative for shortness of breath.   Cardiovascular: Negative for chest pain and palpitations.  Gastrointestinal: Positive for diarrhea. Negative for abdominal pain, nausea and vomiting.  Genitourinary: Negative for dysuria and hematuria.  Musculoskeletal: Negative for arthralgias and back pain.  Skin: Negative for color change and rash.  Neurological: Negative for seizures and syncope.  All other systems reviewed and are negative.    Physical Exam Triage Vital Signs ED Triage Vitals  Enc Vitals Group     BP 04/20/19 1831 105/67     Pulse Rate 04/20/19 1831 68     Resp 04/20/19 1831 18     Temp 04/20/19 1831 99 F (37.2 C)     Temp Source 04/20/19 1831 Oral     SpO2 04/20/19 1831 100 %     Weight --      Height --      Head Circumference --      Peak Flow --      Pain Score 04/20/19 1832 5     Pain Loc --      Pain Edu? --      Excl. in GC? --    No data found.  Updated Vital Signs BP 105/67 (BP Location: Right Arm)   Pulse 68   Temp 99 F (37.2 C) (Oral)   Resp 18   SpO2 100%   Visual Acuity Right Eye Distance:   Left Eye Distance:   Bilateral Distance:    Right Eye Near:   Left Eye Near:     Bilateral Near:     Physical Exam Vitals signs and nursing note reviewed.  Constitutional:      General: She is not in acute distress.    Appearance: She is well-developed.  HENT:     Head: Normocephalic and atraumatic.     Right Ear: Tympanic membrane normal.     Left Ear: Tympanic membrane normal.     Nose: Nose normal.     Mouth/Throat:     Mouth: Mucous membranes are moist.     Pharynx: Oropharynx is clear.  Eyes:     Conjunctiva/sclera: Conjunctivae normal.  Neck:     Musculoskeletal: Neck supple.  Cardiovascular:     Rate and Rhythm: Normal rate and regular rhythm.     Heart sounds: No murmur.  Pulmonary:     Effort: Pulmonary effort is normal. No respiratory distress.     Breath sounds: Normal breath sounds.  Abdominal:     Palpations: Abdomen is soft.     Tenderness: There is no abdominal tenderness. There is no right CVA tenderness, left CVA tenderness, guarding or rebound.  Skin:    General: Skin is warm and dry.     Findings: No rash.  Neurological:     Mental Status: She is alert.      UC Treatments / Results  Labs (all labs ordered are listed, but only abnormal results are displayed) Labs Reviewed  NOVEL CORONAVIRUS, NAA (HOSPITAL ORDER, SEND-OUT TO REF LAB)    EKG   Radiology No results found.  Procedures Procedures (including critical care time)  Medications Ordered in UC Medications - No data to display  Initial Impression / Assessment and Plan / UC Course  I have reviewed the triage vital signs and the nursing notes.  Pertinent labs & imaging results that were available during my care of the patient were reviewed by me and considered in my medical decision making (see chart for details).   Cough, suspected COVID.  COVID test performed.  Instructed patient to self quarantine until her test result is back and negative.  Instructed her to go to the emergency department if she develops shortness of breath, high fever, severe diarrhea, or  other concerning symptoms.  Instructed patient to continue Tylenol or ibuprofen as needed  for discomfort.  Encouraged patient to maintain her hydration.     Final Clinical Impressions(s) / UC Diagnoses   Final diagnoses:  Cough  Suspected Covid-19 Virus Infection     Discharge Instructions     Your COVID test is pending.  You should self quarantine until your test result is back and is negative.    Go to the emergency department if you develop shortness of breath, high fever, severe diarrhea, or other concerning symptoms.         ED Prescriptions    None     Controlled Substance Prescriptions [place] Controlled Substance Registry consulted? Not Applicable   Mickie Bailate, Whalen Trompeter H, NP 04/20/19 1911

## 2019-04-20 NOTE — Discharge Instructions (Addendum)
Your COVID test is pending.  You should self quarantine until your test result is back and is negative.   ° °Go to the emergency department if you develop shortness of breath, high fever, severe diarrhea, or other concerning symptoms.   ° ° ° °

## 2019-04-22 LAB — NOVEL CORONAVIRUS, NAA (HOSP ORDER, SEND-OUT TO REF LAB; TAT 18-24 HRS): SARS-CoV-2, NAA: NOT DETECTED

## 2019-04-24 ENCOUNTER — Encounter (HOSPITAL_COMMUNITY): Payer: Self-pay

## 2021-01-24 ENCOUNTER — Encounter: Payer: Self-pay | Admitting: Obstetrics and Gynecology

## 2021-01-24 ENCOUNTER — Other Ambulatory Visit (HOSPITAL_COMMUNITY)
Admission: RE | Admit: 2021-01-24 | Discharge: 2021-01-24 | Disposition: A | Discharge status: Home / Self Care | Payer: Managed Care, Other (non HMO) | Source: Ambulatory Visit | Attending: Obstetrics and Gynecology | Admitting: Obstetrics and Gynecology

## 2021-01-24 ENCOUNTER — Other Ambulatory Visit: Payer: Self-pay

## 2021-01-24 ENCOUNTER — Ambulatory Visit (INDEPENDENT_AMBULATORY_CARE_PROVIDER_SITE_OTHER): Payer: Managed Care, Other (non HMO) | Admitting: Obstetrics and Gynecology

## 2021-01-24 VITALS — BP 126/84 | Ht 64.5 in | Wt 151.0 lb

## 2021-01-24 DIAGNOSIS — N921 Excessive and frequent menstruation with irregular cycle: Secondary | ICD-10-CM | POA: Diagnosis not present

## 2021-01-24 DIAGNOSIS — Z124 Encounter for screening for malignant neoplasm of cervix: Secondary | ICD-10-CM | POA: Insufficient documentation

## 2021-01-24 DIAGNOSIS — Z113 Encounter for screening for infections with a predominantly sexual mode of transmission: Secondary | ICD-10-CM | POA: Insufficient documentation

## 2021-01-24 MED ORDER — ETONOGESTREL-ETHINYL ESTRADIOL 0.12-0.015 MG/24HR VA RING: VAGINAL_RING | VAGINAL | 4 refills | Status: DC

## 2021-01-24 NOTE — Progress Notes (Signed)
Obstetrics & Gynecology Office Visit    Chief Complaint  Patient presents with  . Vaginal Bleeding    History of Present Illness: 35 y.o. G0P0000 female who presents with a bleeding problem.  She normally has monthly periods that last 7 days.  She has had sporadic month-long bleeding.  Recently she had a two-month period. Her most recent period started on 3/15 and stopped last week.  Last ultrasound was 2017 and was normal.  This was done for fertility reasons.  Her last pap smear was a while ago..  She reports that it was normal. Denies history of STIs.  In the past she has taken a short-term treatment to stop her bleeding.  She has never had a blood transfusion. She has NuvaRing with good results in the past.   Past Medical History:  Diagnosis Date  . No known health problems     Past Surgical History:  Procedure Laterality Date  . MOUTH SURGERY      Gynecologic History: Patient's last menstrual period was 12/23/2020.  Obstetric History: G0P0000  Family History  Problem Relation Age of Onset  . Hypertension Maternal Grandmother   . Hyperlipidemia Maternal Grandmother   . Diabetes Maternal Grandmother   . Cancer Maternal Grandfather        not blood related  . Breast cancer Neg Hx   . Ovarian cancer Neg Hx     Social History   Socioeconomic History  . Marital status: Married    Spouse name: Not on file  . Number of children: Not on file  . Years of education: Not on file  . Highest education level: Not on file  Occupational History  . Not on file  Tobacco Use  . Smoking status: Current Every Day Smoker    Packs/day: 0.25    Years: 15.00    Pack years: 3.75    Types: Cigars  . Smokeless tobacco: Never Used  Vaping Use  . Vaping Use: Never used  Substance and Sexual Activity  . Alcohol use: Yes    Comment: social  . Drug use: No  . Sexual activity: Yes    Birth control/protection: None  Other Topics Concern  . Not on file  Social History Narrative  .  Not on file   Social Determinants of Health   Financial Resource Strain: Not on file  Food Insecurity: Not on file  Transportation Needs: Not on file  Physical Activity: Not on file  Stress: Not on file  Social Connections: Not on file  Intimate Partner Violence: Not on file    No Known Allergies  Prior to Admission medications: PNVs   Review of Systems  Constitutional: Negative.   HENT: Negative.   Eyes: Negative.   Respiratory: Negative.   Cardiovascular: Negative.   Gastrointestinal: Negative.   Genitourinary: Negative.   Musculoskeletal: Negative.   Skin: Negative.   Neurological: Negative.   Psychiatric/Behavioral: Negative.      Physical Exam BP 126/84   Ht 5' 4.5" (1.638 m)   Wt 151 lb (68.5 kg)   LMP 12/23/2020   BMI 25.52 kg/m  Patient's last menstrual period was 12/23/2020. Physical Exam Constitutional:      General: She is not in acute distress.    Appearance: Normal appearance. She is well-developed.  Genitourinary:     Vulva, bladder and urethral meatus normal.     Right Labia: No rash, tenderness, lesions, skin changes or Bartholin's cyst.    Left Labia: No tenderness, lesions, skin changes, Bartholin's  cyst or rash.    No inguinal adenopathy present in the right or left side.    Pelvic Tanner Score: 5/5.     Right Adnexa: not tender, not full and no mass present.    Left Adnexa: not tender, not full and no mass present.    No cervical motion tenderness, friability, lesion or polyp.     Uterus is not enlarged, fixed or tender.     Uterus is retroverted.     No urethral tenderness or mass present.     Pelvic exam was performed with patient in the lithotomy position.  HENT:     Head: Normocephalic and atraumatic.  Eyes:     General: No scleral icterus.    Conjunctiva/sclera: Conjunctivae normal.  Cardiovascular:     Rate and Rhythm: Normal rate and regular rhythm.     Heart sounds: No murmur heard. No friction rub. No gallop.   Pulmonary:      Effort: Pulmonary effort is normal. No respiratory distress.     Breath sounds: Normal breath sounds. No wheezing or rales.  Abdominal:     General: Bowel sounds are normal. There is no distension.     Palpations: Abdomen is soft. There is no mass.     Tenderness: There is no abdominal tenderness. There is no guarding or rebound.     Hernia: There is no hernia in the left inguinal area or right inguinal area.  Musculoskeletal:        General: Normal range of motion.     Cervical back: Normal range of motion and neck supple.  Lymphadenopathy:     Lower Body: No right inguinal adenopathy. No left inguinal adenopathy.  Neurological:     General: No focal deficit present.     Mental Status: She is alert and oriented to person, place, and time.     Cranial Nerves: No cranial nerve deficit.  Skin:    General: Skin is warm and dry.     Findings: No erythema.  Psychiatric:        Mood and Affect: Mood normal.        Behavior: Behavior normal.        Judgment: Judgment normal.     Female chaperone present for pelvic and breast  portions of the physical exam  Assessment: 35 y.o. G0P0000 female here for  1. Menorrhagia with irregular cycle   2. Screen for STD (sexually transmitted disease)   3. Pap smear for cervical cancer screening      Plan: Problem List Items Addressed This Visit   None   Visit Diagnoses    Menorrhagia with irregular cycle    -  Primary   Relevant Medications   etonogestrel-ethinyl estradiol (NUVARING) 0.12-0.015 MG/24HR vaginal ring   Other Relevant Orders   Cytology - PAP   US PELVIC COMPLETE WITH TRANSVAGINAL   Screen for STD (sexually transmitted disease)       Relevant Orders   Cytology - PAP   Pap smear for cervical cancer screening       Relevant Orders   Cytology - PAP     Discussed management options for abnormal uterine bleeding including expectant, NSAIDs, tranexamic acid (Lysteda), oral progesterone (Provera, norethindrone, megace), Depo  Provera, Levonorgestrel containing IUD, endometrial ablation (Novasure) or hysterectomy as definitive surgical management.  Discussed risks and benefits of each method.   Final management decision will hinge on results of patient's work up and whether an underlying etiology for the patients bleeding symptoms  can be discerned.  We will conduct a basic work up examining using the PALM-COIEN classification system.  In the meantime the patient opts to trial NuvaRing while we await results of her ultrasound and labs.   Will follow up based on ultrasound results.   A total of 35 minutes were spent face-to-face with the patient as well as preparation, review, communication, and documentation during this encounter.    Thomasene Mohair, MD 01/24/2021 4:08 PM

## 2021-01-27 LAB — CYTOLOGY - PAP
Chlamydia: NEGATIVE
Comment: NEGATIVE
Comment: NEGATIVE
Comment: NEGATIVE
Comment: NORMAL
Diagnosis: NEGATIVE
High risk HPV: NEGATIVE
Neisseria Gonorrhea: NEGATIVE
Trichomonas: NEGATIVE

## 2021-01-31 ENCOUNTER — Encounter: Payer: Self-pay | Admitting: Obstetrics and Gynecology

## 2021-03-07 ENCOUNTER — Ambulatory Visit: Payer: Managed Care, Other (non HMO)

## 2021-03-08 ENCOUNTER — Ambulatory Visit: Payer: Managed Care, Other (non HMO) | Admitting: Obstetrics and Gynecology

## 2021-03-10 ENCOUNTER — Ambulatory Visit: Payer: Managed Care, Other (non HMO) | Admitting: Obstetrics and Gynecology

## 2021-04-04 ENCOUNTER — Other Ambulatory Visit: Payer: Self-pay

## 2021-04-04 ENCOUNTER — Ambulatory Visit
Admission: RE | Admit: 2021-04-04 | Discharge: 2021-04-04 | Disposition: A | Discharge status: Home / Self Care | Payer: Managed Care, Other (non HMO) | Source: Ambulatory Visit | Attending: Obstetrics and Gynecology | Admitting: Obstetrics and Gynecology

## 2021-04-04 DIAGNOSIS — N921 Excessive and frequent menstruation with irregular cycle: Secondary | ICD-10-CM

## 2021-04-06 ENCOUNTER — Encounter: Payer: Self-pay | Admitting: Obstetrics and Gynecology

## 2021-04-06 ENCOUNTER — Ambulatory Visit (INDEPENDENT_AMBULATORY_CARE_PROVIDER_SITE_OTHER): Payer: Managed Care, Other (non HMO) | Admitting: Obstetrics and Gynecology

## 2021-04-06 ENCOUNTER — Other Ambulatory Visit: Payer: Self-pay

## 2021-04-06 VITALS — BP 116/70 | Ht 64.5 in | Wt 152.8 lb

## 2021-04-06 DIAGNOSIS — N921 Excessive and frequent menstruation with irregular cycle: Secondary | ICD-10-CM | POA: Diagnosis not present

## 2021-04-20 NOTE — Progress Notes (Signed)
Patient ID: Tara Stewart, female   DOB: [birth year], 35 y.o.   MRN: [id number]  Reason for Consult: Gynecologic Exam   Referred by No ref. provider found  Subjective:     HPI:  Tara Stewart is a 35 y.o. female she is following up today after her pelvic ultrasound.  She saw Dr. Jean Rosenthal previously for abnormal uterine bleeding and was started on NuvaRing.  She reports that since using this medication she has had improvement to her menstrual pattern and menorrhagia.  Gynecological History  Patient's last menstrual period was 03/25/2021.    Past Medical History:  Diagnosis Date   No known health problems    Family History  Problem Relation Age of Onset   Hypertension Maternal Grandmother    Hyperlipidemia Maternal Grandmother    Diabetes Maternal Grandmother    Cancer Maternal Grandfather        not blood related   Breast cancer Neg Hx    Ovarian cancer Neg Hx    Past Surgical History:  Procedure Laterality Date   MOUTH SURGERY      Short Social History:  Social History   Tobacco Use   Smoking status: Every Day    Packs/day: 0.25    Years: 15.00    Pack years: 3.75    Types: Cigars, Cigarettes   Smokeless tobacco: Never  Substance Use Topics   Alcohol use: Yes    Comment: social    No Known Allergies  Current Outpatient Medications  Medication Sig Dispense Refill   etonogestrel-ethinyl estradiol (NUVARING) 0.12-0.015 MG/24HR vaginal ring Insert vaginally and leave in place for 3 consecutive weeks, then remove for 1 week. 9 each 4   doxycycline (VIBRAMYCIN) 100 MG capsule Take 1 capsule (100 mg total) by mouth 2 (two) times daily. (Patient not taking: No sig reported) 28 capsule 0   letrozole (FEMARA) 2.5 MG tablet Take 1 tablet (2.5 mg total) by mouth daily. Take on days 3 to 7 following a spontaneous menses or progestin-induced bleed. (Patient not taking: No sig reported) 5 tablet 2   metroNIDAZOLE (FLAGYL) 500 MG tablet Take 1 tablet (500 mg total) by  mouth 2 (two) times daily. (Patient not taking: No sig reported) 28 tablet 0   naproxen (NAPROSYN) 500 MG tablet Take 1 tablet (500 mg total) by mouth 2 (two) times daily. (Patient not taking: Reported on 01/24/2021) 30 tablet 0   No current facility-administered medications for this visit.    Review of Systems  Constitutional: Negative for chills, fatigue, fever and unexpected weight change.  HENT: Negative for trouble swallowing.  Eyes: Negative for loss of vision.  Respiratory: Negative for cough, shortness of breath and wheezing.  Cardiovascular: Negative for chest pain, leg swelling, palpitations and syncope.  GI: Negative for abdominal pain, blood in stool, diarrhea, nausea and vomiting.  GU: Negative for difficulty urinating, dysuria, frequency and hematuria.  Musculoskeletal: Negative for back pain, leg pain and joint pain.  Skin: Negative for rash.  Neurological: Negative for dizziness, headaches, light-headedness, numbness and seizures.  Psychiatric: Negative for behavioral problem, confusion, depressed mood and sleep disturbance.       Objective:  Objective   Vitals:   04/06/21 1619  BP: 116/70  Weight: 152 lb 12.8 oz (69.3 kg)  Height: 5' 4.5" (1.638 m)   Body mass index is 25.82 kg/m.  Physical Exam Vitals and nursing note reviewed. Exam conducted with a chaperone present.  Constitutional:      Appearance: Normal appearance.  HENT:  Head: Normocephalic and atraumatic.  Eyes:     Extraocular Movements: Extraocular movements intact.     Pupils: Pupils are equal, round, and reactive to light.  Cardiovascular:     Rate and Rhythm: Normal rate and regular rhythm.  Pulmonary:     Effort: Pulmonary effort is normal.     Breath sounds: Normal breath sounds.  Abdominal:     General: Abdomen is flat.     Palpations: Abdomen is soft.  Musculoskeletal:     Cervical back: Normal range of motion.  Skin:    General: Skin is warm and dry.  Neurological:      General: No focal deficit present.     Mental Status: She is alert and oriented to person, place, and time.  Psychiatric:        Behavior: Behavior normal.        Thought Content: Thought content normal.        Judgment: Judgment normal.    Assessment/Plan:     35 year old following up for pelvic ultrasound results.  Pelvic ultrasound results reviewed with patient.  No abnormal findings.  Continue with current plan for birth control and follow-up as needed.  More than 10 minutes were spent face to face with the patient in the room, reviewing the medical record, labs and images, and coordinating care for the patient. The plan of management was discussed in detail and counseling was provided.      Adelene Idler MD [facility], [facility] 04/20/2021 9:55 AM

## 2021-04-28 ENCOUNTER — Ambulatory Visit
Admission: RE | Admit: 2021-04-28 | Discharge: 2021-04-28 | Disposition: A | Discharge status: Home / Self Care | Payer: Managed Care, Other (non HMO) | Source: Ambulatory Visit | Attending: Emergency Medicine | Admitting: Emergency Medicine

## 2021-04-28 ENCOUNTER — Other Ambulatory Visit: Payer: Self-pay

## 2021-04-28 VITALS — BP 136/90 | HR 74 | Temp 98.7°F | Resp 20

## 2021-04-28 DIAGNOSIS — B349 Viral infection, unspecified: Secondary | ICD-10-CM | POA: Diagnosis not present

## 2021-04-28 DIAGNOSIS — Z1152 Encounter for screening for COVID-19: Secondary | ICD-10-CM

## 2021-04-28 NOTE — ED Provider Notes (Signed)
Diarrhea [facility]    CSN: [id number] Arrival date & time: 04/28/21  0945      History   Chief Complaint Chief Complaint  Patient presents with   Nasal Congestion   Cough   Shortness of Breath   Generalized Body Aches     HPI Tara Stewart is a 35 y.o. female.  Patient presents with chills, body aches, nasal congestion, runny nose, sore throat, cough, shortness of breath, and diarrhea x 2 days.  1 episode of diarrhea yesterday; none today.  She denies rash, vomiting, or other symptoms.  Treatment attempted at home with ibuprofen.  She just returned from a trip to [place].  No pertinent medical history.  The history is provided by the patient.   Past Medical History:  Diagnosis Date   No known health problems     Patient Active Problem List   Diagnosis Date Noted   Anovulatory cycle 10/24/2016    Past Surgical History:  Procedure Laterality Date   MOUTH SURGERY      OB History     Gravida  0   Para  0   Term  0   Preterm  0   AB  0   Living  0      SAB  0   IAB  0   Ectopic  0   Multiple  0   Live Births  0            Home Medications    Prior to Admission medications   Medication Sig Start Date End Date Taking? Authorizing Provider  doxycycline (VIBRAMYCIN) 100 MG capsule Take 1 capsule (100 mg total) by mouth 2 (two) times daily. Patient not taking: No sig reported 09/08/18   Eustace Moore, MD  etonogestrel-ethinyl estradiol (NUVARING) 0.12-0.015 MG/24HR vaginal ring Insert vaginally and leave in place for 3 consecutive weeks, then remove for 1 week. 01/24/21   Conard Novak, MD  letrozole [facility]) 2.5 MG tablet Take 1 tablet (2.5 mg total) by mouth daily. Take on days 3 to 7 following a spontaneous menses or progestin-induced bleed. Patient not taking: No sig reported 10/24/16   Hermina Staggers, MD  metroNIDAZOLE (FLAGYL) 500 MG tablet Take 1 tablet (500 mg total) by mouth 2 (two) times daily. Patient not taking:  No sig reported 09/08/18   Eustace Moore, MD  naproxen (NAPROSYN) 500 MG tablet Take 1 tablet (500 mg total) by mouth 2 (two) times daily. Patient not taking: Reported on 01/24/2021 09/08/18   Eustace Moore, MD    Family History Family History  Problem Relation Age of Onset   Hypertension Maternal Grandmother    Hyperlipidemia Maternal Grandmother    Diabetes Maternal Grandmother    Cancer Maternal Grandfather        not blood related   Breast cancer Neg Hx    Ovarian cancer Neg Hx     Social History Social History   Tobacco Use   Smoking status: Every Day    Packs/day: 0.25    Years: 15.00    Pack years: 3.75    Types: Cigars, Cigarettes   Smokeless tobacco: Never  Vaping Use   Vaping Use: Never used  Substance Use Topics   Alcohol use: Yes    Comment: social   Drug use: No     Allergies   Patient has no known allergies.   Review of Systems Review of Systems  Constitutional:  Positive for chills. Negative for fever.  HENT:  Positive for congestion. Negative for ear pain and sore throat.   Respiratory:  Positive for cough and shortness of breath.   Cardiovascular:  Negative for chest pain and palpitations.  Gastrointestinal:  Positive for diarrhea. Negative for abdominal pain and vomiting.  Skin:  Negative for color change and rash.  All other systems reviewed and are negative.   Physical Exam Triage Vital Signs ED Triage Vitals  Enc Vitals Group     BP      Pulse      Resp      Temp      Temp src      SpO2      Weight      Height      Head Circumference      Peak Flow      Pain Score      Pain Loc      Pain Edu?      Excl. in GC?    No data found.  Updated Vital Signs BP 136/90   Pulse 74   Temp 98.7 F (37.1 C)   Resp 20   SpO2 94%   Visual Acuity Right Eye Distance:   Left Eye Distance:   Bilateral Distance:    Right Eye Near:   Left Eye Near:    Bilateral Near:     Physical Exam Vitals and nursing note reviewed.   Constitutional:      General: She is not in acute distress.    Appearance: She is well-developed. She is not ill-appearing.  HENT:     Head: Normocephalic and atraumatic.     Right Ear: Tympanic membrane normal.     Left Ear: Tympanic membrane normal.     Nose: Nose normal.     Mouth/Throat:     Mouth: Mucous membranes are moist.     Pharynx: Oropharynx is clear.  Eyes:     Conjunctiva/sclera: Conjunctivae normal.  Cardiovascular:     Rate and Rhythm: Normal rate and regular rhythm.     Heart sounds: Normal heart sounds.  Pulmonary:     Effort: Pulmonary effort is normal. No respiratory distress.     Breath sounds: Normal breath sounds.  Abdominal:     Palpations: Abdomen is soft.     Tenderness: There is no abdominal tenderness.  Musculoskeletal:     Cervical back: Neck supple.  Skin:    General: Skin is warm and dry.  Neurological:     General: No focal deficit present.     Mental Status: She is alert and oriented to person, place, and time.  Psychiatric:        Mood and Affect: Mood normal.        Behavior: Behavior normal.     UC Treatments / Results  Labs (all labs ordered are listed, but only abnormal results are displayed) Labs Reviewed  NOVEL CORONAVIRUS, NAA    EKG   Radiology No results found.  Procedures Procedures (including critical care time)  Medications Ordered in UC Medications - No data to display  Initial Impression / Assessment and Plan / UC Course  I have reviewed the triage vital signs and the nursing notes.  Pertinent labs & imaging results that were available during my care of the patient were reviewed by me and considered in my medical decision making (see chart for details).   Viral illness.  COVID pending.  Instructed patient to self quarantine per CDC guidelines.  Discussed symptomatic treatment including Tylenol  or ibuprofen, rest, hydration.  Instructed patient to follow up with PCP if symptoms are not improving.  Patient  agrees to plan of care.    Final Clinical Impressions(s) / UC Diagnoses   Final diagnoses:  Encounter for screening for COVID-19  Viral illness     Discharge Instructions      Your COVID test is pending.  You should self quarantine until the test result is back.    Take Tylenol or ibuprofen as needed for fever or discomfort.  Rest and keep yourself hydrated.    Follow-up with your primary care provider if your symptoms are not improving.         ED Prescriptions   None    PDMP not reviewed this encounter.   Mickie Bail, NP 04/28/21 1019

## 2021-04-28 NOTE — Discharge Instructions (Addendum)
Your COVID test is pending.  You should self quarantine until the test result is back.    Take Tylenol or ibuprofen as needed for fever or discomfort.  Rest and keep yourself hydrated.    Follow-up with your primary care provider if your symptoms are not improving.     

## 2021-04-28 NOTE — ED Triage Notes (Signed)
Pt here after traveling to [place] last week and returning on Tuesday and getting URI sx then. Congestion, SOB, body aches, sweats.

## 2021-04-29 LAB — SARS-COV-2, NAA 2 DAY TAT

## 2021-04-29 LAB — NOVEL CORONAVIRUS, NAA: SARS-CoV-2, NAA: NOT DETECTED

## 2021-09-26 ENCOUNTER — Encounter: Payer: Self-pay | Admitting: Emergency Medicine

## 2021-09-26 ENCOUNTER — Ambulatory Visit
Admission: EM | Admit: 2021-09-26 | Discharge: 2021-09-26 | Disposition: A | Discharge status: Home / Self Care | Payer: Managed Care, Other (non HMO) | Attending: Emergency Medicine | Admitting: Emergency Medicine

## 2021-09-26 DIAGNOSIS — R110 Nausea: Secondary | ICD-10-CM

## 2021-09-26 DIAGNOSIS — Z3202 Encounter for pregnancy test, result negative: Secondary | ICD-10-CM

## 2021-09-26 LAB — POCT URINE PREGNANCY: Preg Test, Ur: NEGATIVE

## 2021-09-26 MED ORDER — DOXYLAMINE-PYRIDOXINE 10-10 MG PO TBEC: 2.0000 | DELAYED_RELEASE_TABLET | Freq: Every day | ORAL | 0 refills | Status: AC

## 2021-09-26 NOTE — Discharge Instructions (Addendum)
Try ginger containing foods such as Tara Stewart. You can also try the Diclegis.  If it is too expensive, you can take 50 mg of vitamin B6 with 12.5 mg of doxylamine 3 times a day as needed.  Both of these are available over-the-counter.  Frequent small meals.  Repeat your urine pregnancy in 1 week.  If you are pregnant, follow-up with [facility], otherwise, follow-up with a primary care provider of your choice.  See list below.  Here is a list of primary care providers who are taking new patients:  Dr. Elizabeth Sauer [address] [place] [address] [phone]  [facility] at [facility] [address] [place], [place] [address] [phone]  [facility] [place] [address] [place] [address]  [phone]  [facility] [address] [place], [place] [address] [phone]  [facility] [address] [place]  [phone] [place], [place] [address]  Here are clinics/ other resources who will see you if you do not have insurance. Some have certain criteria that you must meet. Call them and find out what they are:  [facility]: [address] [place], [place] [address] Phone: [phone] Hours: First and Third Saturdays of each Month, 9 a.m. - 1 p.m.  [facility]: [address], [address] [place], [place] [address] Phone: [phone] Hours: Tuesday, 4 p.m. - 8 p.m. Thursday, 1 p.m. - 8 p.m. Wednesday, 9 a.m. - [facility] [address] [place], [place] [address] Phone: [phone] Pharmacy Phone Number: [phone] Dental Phone Number: [phone] [facility] Insurance Help: [phone]  Dental Hours: Monday - Thursday, 8 a.m. - 6 p.m.  [facility] [address] [place], [place] [address] Phone: [phone] Pharmacy Phone Number: [phone] [facility] Insurance Help: [phone]  [facility] [address] [place], [place] [address] Phone:  [phone] Pharmacy Phone Number: [phone] [facility] Insurance Help: [phone]  [facility] [address] [place], [place] [address] Phone: [phone] [facility] Insurance Help: [phone]   [facility] [address] [place], [place] [address] Phone: [phone]  Go to www.goodrx.com  or www.costplusdrugs.com to look up your medications. This will give you a list of where you can find your prescriptions at the most affordable prices. Or ask the pharmacist what the cash price is, or if they have any other discount programs available to help make your medication more affordable. This can be less expensive than what you would pay with insurance.

## 2021-09-26 NOTE — ED Provider Notes (Signed)
HPI  SUBJECTIVE:  Tara Stewart is a 36 y.o. female who presents with 2 weeks of nausea that is worse in the morning. Becomes intermittent in the afternoon.  She reports increased sensitivity to smells, increased appetite, increased forgetfulness, unusual cravings.  No abdominal pain, diarrhea, fevers, urinary complaints.  She has been in her usual state of health up until today.   She took out her NuvaRing in December and does not remember when her last period was.  She is having unprotected intercourse with her husband, last intercourse 2 days ago.  She is not actively trying to get pregnant, but would not mind a pregnancy.  Past medical history negative for diabetes, hypertension.  LMP: Cannot remember.  PMD: None.  Past Medical History:  Diagnosis Date   No known health problems     Past Surgical History:  Procedure Laterality Date   MOUTH SURGERY      Family History  Problem Relation Age of Onset   Hypertension Maternal Grandmother    Hyperlipidemia Maternal Grandmother    Diabetes Maternal Grandmother    Cancer Maternal Grandfather        not blood related   Breast cancer Neg Hx    Ovarian cancer Neg Hx     Social History   Tobacco Use   Smoking status: Former    Packs/day: 0.25    Years: 15.00    Pack years: 3.75    Types: Cigars, Cigarettes   Smokeless tobacco: Never  Vaping Use   Vaping Use: Never used  Substance Use Topics   Alcohol use: Yes    Comment: social   Drug use: No    No current facility-administered medications for this encounter.  Current Outpatient Medications:    Doxylamine-Pyridoxine 10-10 MG TBEC, Take 2 tablets by mouth at bedtime. Start 2 tabs at night.  If symptoms persist after 2 days, increase to 1 tab in the morning and 2 tabs at night.  May increase to 1 tab in the morning, 1 tab in the afternoon and 2 tabs at night., Disp: 60 tablet, Rfl: 0   letrozole (FEMARA) 2.5 MG tablet, Take 1 tablet (2.5 mg total) by mouth daily. Take on days 3  to 7 following a spontaneous menses or progestin-induced bleed. (Patient not taking: No sig reported), Disp: 5 tablet, Rfl: 2  No Known Allergies   ROS  As noted in HPI.   Physical Exam  BP 116/75 (BP Location: Left Arm)    Pulse 82    Temp 98 F (36.7 C) (Oral)    Resp 18    LMP  (LMP Unknown)    SpO2 97%   Constitutional: Well developed, well nourished, no acute distress Eyes:  EOMI, conjunctiva normal bilaterally HENT: Normocephalic, atraumatic,mucus membranes moist Respiratory: Normal inspiratory effort, lungs clear bilaterally Cardiovascular: Normal rate, regular rhythm, no murmurs, rubs, gallop GI: nondistended soft, nontender, active bowel sounds.  No guarding, rebound skin: No rash, skin intact Musculoskeletal: no deformities Neurologic: Alert & oriented x 3, no focal neuro deficits Psychiatric: Speech and behavior appropriate   ED Course   Medications - No data to display  Orders Placed This Encounter  Procedures   POCT urine pregnancy    Standing Status:   Standing    Number of Occurrences:   1    Results for orders placed or performed during the hospital encounter of 09/26/21 (from the past 24 hour(s))  POCT urine pregnancy     Status: None   Collection Time: 09/26/21  1:56 PM  Result Value Ref Range   Preg Test, Ur Negative Negative   No results found.  ED Clinical Impression  1. Nausea without vomiting   2. Negative pregnancy test      ED Assessment/Plan  Urine pregnancy negative.  Unsure as to the etiology of her symptoms, very early pregnancy is in the differential.  Her abdomen is benign, she appears well-hydrated, her vitals are normal.  Will send home with Diclegis and not Zofran.  While she is not trying to actively get pregnant, she is having unprotected intercourse with her husband, and would welcome a pregnancy.  If Diclegis is too expensive, recommended vitamin B6/Unisom, ginger, frequent small meals, repeat pregnancy test in 1 week.  She  will follow-up with [facility] if urine pregnancy is positive, otherwise, will provide primary care list and order assistance in finding a PMD.  May return here if symptoms start to localize.  Discussed labs, MDM, treatment plan, and plan for follow-up with patient.  patient agrees with plan.   Meds ordered this encounter  Medications   Doxylamine-Pyridoxine 10-10 MG TBEC    Sig: Take 2 tablets by mouth at bedtime. Start 2 tabs at night.  If symptoms persist after 2 days, increase to 1 tab in the morning and 2 tabs at night.  May increase to 1 tab in the morning, 1 tab in the afternoon and 2 tabs at night.    Dispense:  60 tablet    Refill:  0      *This clinic note was created using [employer]. Therefore, there may be occasional mistakes despite careful proofreading.  ?    Melynda Ripple, MD 09/26/21 1422

## 2021-09-26 NOTE — ED Triage Notes (Addendum)
Pt has been nauseous and fatigue x 1 month and pt can not remember when her last period was. She removed her Nuvaring in December.
